# Patient Record
Sex: Female | Born: 1980 | Race: White | Hispanic: No | Marital: Married | Smoking: Never smoker
Health system: Southern US, Community
[De-identification: ages and names within clinical notes are randomized; demographics above are authoritative.]

## PROBLEM LIST (undated history)

## (undated) DIAGNOSIS — Z789 Other specified health status: Secondary | ICD-10-CM

## (undated) HISTORY — DX: Other specified health status: Z78.9

## (undated) HISTORY — PX: OVARIAN CYST REMOVAL: SHX89

## (undated) HISTORY — PX: PILONIDAL CYST EXCISION: SHX744

## (undated) HISTORY — PX: TUBAL LIGATION: SHX77

---

## 2005-08-18 ENCOUNTER — Ambulatory Visit: Payer: Self-pay | Admitting: Obstetrics & Gynecology

## 2006-01-13 ENCOUNTER — Emergency Department: Payer: Self-pay | Admitting: Unknown Physician Specialty

## 2006-03-12 ENCOUNTER — Emergency Department: Payer: Self-pay | Admitting: Emergency Medicine

## 2006-03-13 ENCOUNTER — Inpatient Hospital Stay: Payer: Self-pay | Admitting: Obstetrics & Gynecology

## 2006-09-04 ENCOUNTER — Ambulatory Visit: Payer: Self-pay | Admitting: Internal Medicine

## 2007-03-30 ENCOUNTER — Ambulatory Visit: Payer: Self-pay | Admitting: Internal Medicine

## 2007-06-20 ENCOUNTER — Ambulatory Visit: Payer: Self-pay | Admitting: Internal Medicine

## 2007-11-25 ENCOUNTER — Emergency Department: Payer: Self-pay | Admitting: Emergency Medicine

## 2008-02-25 ENCOUNTER — Ambulatory Visit: Payer: Self-pay | Admitting: Internal Medicine

## 2009-07-31 ENCOUNTER — Observation Stay: Payer: Self-pay

## 2009-08-01 ENCOUNTER — Inpatient Hospital Stay: Payer: Self-pay | Admitting: Unknown Physician Specialty

## 2009-08-04 ENCOUNTER — Ambulatory Visit: Payer: Self-pay | Admitting: Pediatrics

## 2011-08-25 ENCOUNTER — Ambulatory Visit: Payer: Self-pay

## 2011-08-25 LAB — CBC WITH DIFFERENTIAL/PLATELET
HCT: 35.2 % (ref 35.0–47.0)
Lymphocyte #: 1.4 10*3/uL (ref 1.0–3.6)
Lymphocyte %: 19 %
MCHC: 35.1 g/dL (ref 32.0–36.0)
MCV: 87 fL (ref 80–100)
Monocyte %: 7.3 %
RBC: 4.04 10*6/uL (ref 3.80–5.20)
RDW: 12.6 % (ref 11.5–14.5)
WBC: 7.2 10*3/uL (ref 3.6–11.0)

## 2011-08-26 ENCOUNTER — Inpatient Hospital Stay: Payer: Self-pay

## 2014-08-10 NOTE — Op Note (Signed)
PATIENT NAME:  Victoria White, [name] MR#:  [id number] DATE OF BIRTH:  1981/03/26  DATE OF PROCEDURE:  08/26/2011  PREOPERATIVE DIAGNOSIS: Intrauterine pregnancy, term, footling breech presentation.   POSTOPERATIVE DIAGNOSIS: Intrauterine pregnancy, term, footling breech presentation.   PROCEDURE PERFORMED: Low transverse cesarean section.   SURGEON: Deloris Pinghilip J. Luella Cookosenow, M.D.   FIRST ASSISTANT: [address]   ANESTHESIA:  Conduction.   OPERATIVE FINDINGS: Female infant, Reuel BoomDaniel.   DESCRIPTION OF PROCEDURE: After adequate conduction anesthesia, the patient was prepped and draped in routine fashion. A skin incision in modified Pfannenstiel fashion was made and carried down the various layers and the peritoneal cavity was entered. Bladder flap was created and the bladder was pushed down. A low transverse incision was made whereupon double footling breech was encountered. This was delivered without difficulty. The placenta was removed manually. The uterus was then reapproximated with continuous suture of Maxon. A bilateral partial salpingectomy was then performed with both tubes being doubly ligated and a portion removed. All areas of surgery were inspected and found to be hemostatic. The pelvis was lavaged with copious of saline. Interceed was placed. Rectus muscles were reapproximated in the midline. On-Q pump was inserted. The fascia was reapproximated with continuous suture of Maxon. The skin was closed with skin staples. Estimated blood loss 500 mL. The patient tolerated the procedure well and left the Operating Room in good condition. Sponge and needle counts were said to be correct at the end of the procedure.   ____________________________ Deloris PingPhilip J. Luella Cookosenow, MD pjr:ap D: 08/26/2011 09:52:33 ET                T: 08/26/2011 11:56:41 ET JOB#: [id number] cc: Deloris PingPhilip J. Luella Cookosenow, MD, <Dictator> Towana BadgerPHILIP J Suella Cogar MD ELECTRONICALLY SIGNED 08/28/2011 6:25

## 2015-03-24 ENCOUNTER — Emergency Department
Admission: EM | Admit: 2015-03-24 | Discharge: 2015-03-24 | Disposition: A | Discharge status: Home / Self Care | Payer: Self-pay | Attending: Emergency Medicine | Admitting: Emergency Medicine

## 2015-03-24 ENCOUNTER — Emergency Department: Payer: Self-pay

## 2015-03-24 ENCOUNTER — Encounter: Payer: Self-pay | Admitting: Medical Oncology

## 2015-03-24 DIAGNOSIS — W010XXA Fall on same level from slipping, tripping and stumbling without subsequent striking against object, initial encounter: Secondary | ICD-10-CM | POA: Insufficient documentation

## 2015-03-24 DIAGNOSIS — Y9301 Activity, walking, marching and hiking: Secondary | ICD-10-CM | POA: Insufficient documentation

## 2015-03-24 DIAGNOSIS — S52591A Other fractures of lower end of right radius, initial encounter for closed fracture: Secondary | ICD-10-CM | POA: Insufficient documentation

## 2015-03-24 DIAGNOSIS — Y9289 Other specified places as the place of occurrence of the external cause: Secondary | ICD-10-CM | POA: Insufficient documentation

## 2015-03-24 DIAGNOSIS — S52611A Displaced fracture of right ulna styloid process, initial encounter for closed fracture: Secondary | ICD-10-CM | POA: Insufficient documentation

## 2015-03-24 DIAGNOSIS — Y998 Other external cause status: Secondary | ICD-10-CM | POA: Insufficient documentation

## 2015-03-24 DIAGNOSIS — S52501A Unspecified fracture of the lower end of right radius, initial encounter for closed fracture: Secondary | ICD-10-CM

## 2015-03-24 MED ORDER — OXYCODONE-ACETAMINOPHEN 5-325 MG PO TABS
1.0000 | ORAL_TABLET | Freq: Once | ORAL | Status: AC
  Administered 2015-03-24: 1 via ORAL

## 2015-03-24 MED ORDER — OXYCODONE-ACETAMINOPHEN 5-325 MG PO TABS: 1.0000 | ORAL_TABLET | Freq: Four times a day (QID) | ORAL | Status: DC | PRN

## 2015-03-24 MED ORDER — OXYCODONE-ACETAMINOPHEN 5-325 MG PO TABS
ORAL_TABLET | ORAL | Status: DC
Start: 2015-03-24 — End: 2015-03-24
  Filled 2015-03-24: qty 1

## 2015-03-24 NOTE — ED Notes (Signed)
Pt presents with right wrist pain after falling this am. Cms intact. Swelling with possible deformity noted to right wrist.

## 2015-03-24 NOTE — ED Notes (Signed)
Pt reports slipping and falling this am, rt wrist pain.

## 2015-03-24 NOTE — Discharge Instructions (Signed)
Cast or Splint Care °Casts and splints support injured limbs and keep bones from moving while they heal. It is important to care for your cast or splint at home.   °HOME CARE INSTRUCTIONS °· Keep the cast or splint uncovered during the drying period. It can take 24 to 48 hours to dry if it is made of plaster. A fiberglass cast will dry in less than 1 hour. °· Do not rest the cast on anything harder than a pillow for the first 24 hours. °· Do not put weight on your injured limb or apply pressure to the cast until your health care provider gives you permission. °· Keep the cast or splint dry. Wet casts or splints can lose their shape and may not support the limb as well. A wet cast that has lost its shape can also create harmful pressure on your skin when it dries. Also, wet skin can become infected. °· Cover the cast or splint with a plastic bag when bathing or when out in the rain or snow. If the cast is on the trunk of the body, take sponge baths until the cast is removed. °· If your cast does become wet, dry it with a towel or a blow dryer on the cool setting only. °· Keep your cast or splint clean. Soiled casts may be wiped with a moistened cloth. °· Do not place any hard or soft foreign objects under your cast or splint, such as cotton, toilet paper, lotion, or powder. °· Do not try to scratch the skin under the cast with any object. The object could get stuck inside the cast. Also, scratching could lead to an infection. If itching is a problem, use a blow dryer on a cool setting to relieve discomfort. °· Do not trim or cut your cast or remove padding from inside of it. °· Exercise all joints next to the injury that are not immobilized by the cast or splint. For example, if you have a long leg cast, exercise the hip joint and toes. If you have an arm cast or splint, exercise the shoulder, elbow, thumb, and fingers. °· Elevate your injured arm or leg on 1 or 2 pillows for the first 1 to 3 days to decrease  swelling and pain. It is best if you can comfortably elevate your cast so it is higher than your heart. °SEEK MEDICAL CARE IF:  °· Your cast or splint cracks. °· Your cast or splint is too tight or too loose. °· You have unbearable itching inside the cast. °· Your cast becomes wet or develops a soft spot or area. °· You have a bad smell coming from inside your cast. °· You get an object stuck under your cast. °· Your skin around the cast becomes red or raw. °· You have new pain or worsening pain after the cast has been applied. °SEEK IMMEDIATE MEDICAL CARE IF:  °· You have fluid leaking through the cast. °· You are unable to move your fingers or toes. °· You have discolored (blue or white), cool, painful, or very swollen fingers or toes beyond the cast. °· You have tingling or numbness around the injured area. °· You have severe pain or pressure under the cast. °· You have any difficulty with your breathing or have shortness of breath. °· You have chest pain. °  °This information is not intended to replace advice given to you by your health care provider. Make sure you discuss any questions you have with your health care   provider. °  °Document Released: 04/01/2000 Document Revised: 01/23/2013 Document Reviewed: 10/11/2012 °Elsevier Interactive Patient Education ©2016 [employer] ° °Forearm Fracture °A forearm fracture is a break in one or both of the bones of your arm that are between the elbow and the wrist. Your forearm is made up of two bones: °· Radius. This is the bone on the inside of your arm near your thumb. °· Ulna. This is the bone on the outside of your arm near your little finger. °Middle forearm fractures usually break both the radius and the ulna. Most forearm fractures that involve both the ulna and radius will require surgery. °CAUSES °Common causes of this type of fracture include: °· Falling on an outstretched arm. °· Accidents, such as a car or bike accident. °· A hard, direct hit to the middle  part of your arm. °RISK FACTORS °You may be at higher risk for this type of fracture if: °· You play contact sports. °· You have a condition that causes your bones to be weak or thin (osteoporosis). °SIGNS AND SYMPTOMS °A forearm fracture causes pain immediately after the injury. Other signs and symptoms include: °· An abnormal bend or bump in your arm (deformity). °· Swelling. °· Numbness or tingling. °· Tenderness. °· Inability to turn your hand from side to side (rotate). °· Bruising. °DIAGNOSIS °Your health care provider may diagnose a forearm fracture based on: °· Your symptoms. °· Your medical history, including any recent injury. °· A physical exam. Your health care provider will look for any deformity and feel for tenderness over the break. Your health care provider will also check whether the bones are out of place. °· An X-ray exam to confirm the diagnosis and learn more about the type of fracture. °TREATMENT °The goals of treatment are to get the bone or bones in proper position for healing and to keep the bones from moving so they will heal over time. Your treatment will depend on many factors, especially the type of fracture that you have. °· If the fractured bone or bones: °¨ Are in the correct position (nondisplaced), you may only need to wear a cast or a splint. °¨ Have a slightly displaced fracture, you may need to have the bones moved back into place manually (closed reduction) before the splint or cast is put on. °· You may have a temporary splint before you have a cast. The splint allows room for some swelling. After a few days, a cast can replace the splint. °· You may have to wear the cast for 6-8 weeks or as directed by your health care provider. °· The cast may be changed after about 3 weeks or as directed by your health care provider. °· After your cast is removed, you may need physical therapy to regain full movement in your wrist or elbow. °· You may need emergency surgery if you  have: °¨ A fractured bone or bones that are out of position (displaced). °¨ A fracture with multiple fragments (comminuted fracture). °¨ A fracture that breaks the skin (open fracture). This type of fracture may require surgical wires, plates, or screws to hold the bone or bones in place. °· You may have X-rays every couple of weeks to check on your healing. °HOME CARE INSTRUCTIONS °If You Have a Cast: °· Do not stick anything inside the cast to scratch your skin. Doing that increases your risk of infection. °· Check the skin around the cast every day. Report any concerns to your health care   provider. You may put lotion on dry skin around the edges of the cast. Do not apply lotion to the skin underneath the cast. °If You Have a Splint: °· Wear it as directed by your health care provider. Remove it only as directed by your health care provider. °· Loosen the splint if your fingers become numb and tingle, or if they turn cold and blue. °Bathing °· Cover the cast or splint with a watertight plastic bag to protect it from water while you bathe or shower. Do not let the cast or splint get wet. °Managing Pain, Stiffness, and Swelling °· If directed, apply ice to the injured area: °¨ Put ice in a plastic bag. °¨ Place a towel between your skin and the bag. °¨ Leave the ice on for 20 minutes, 2-3 times a day. °· Move your fingers often to avoid stiffness and to lessen swelling. °· Raise the injured area above the level of your heart while you are sitting or lying down. °Driving °· Do not drive or operate heavy machinery while taking pain medicine. °· Do not drive while wearing a cast or splint on a hand that you use for driving. °Activity °· Return to your normal activities as directed by your health care provider. Ask your health care provider what activities are safe for you. °· Perform range-of-motion exercises only as directed by your health care provider. °Safety °· Do not use your injured limb to support your body  weight until your health care provider says that you can. °General Instructions °· Do not put pressure on any part of the cast or splint until it is fully hardened. This may take several hours. °· Keep the cast or splint clean and dry. °· Do not use any tobacco products, including cigarettes, chewing tobacco, or electronic cigarettes. Tobacco can delay bone healing. If you need help quitting, ask your health care provider. °· Take medicines only as directed by your health care provider. °· Keep all follow-up visits as directed by your health care provider. This is important. °SEEK MEDICAL CARE IF: °· Your pain medicine is not helping. °· Your cast or splint becomes wet or damaged or suddenly feels too tight. °· Your cast becomes loose. °· You have more severe pain or swelling than you did before the cast. °· You have severe pain when you stretch your fingers. °· You continue to have pain or stiffness in your elbow or your wrist after your cast is removed. °SEEK IMMEDIATE MEDICAL CARE IF: °· You cannot move your fingers. °· You lose feeling in your fingers or your hand. °· Your hand or your fingers turn cold and pale or blue. °· You notice a bad smell coming from your cast. °· You have drainage from underneath your cast. °· You have new stains from blood or drainage that is coming through your cast. °  °This information is not intended to replace advice given to you by your health care provider. Make sure you discuss any questions you have with your health care provider. °  °Document Released: 04/01/2000 Document Revised: 04/25/2014 Document Reviewed: 11/18/2013 °Elsevier Interactive Patient Education ©2016 Elsevier Inc. ° °

## 2015-03-24 NOTE — ED Provider Notes (Signed)
[facility] Emergency Department Provider Note  ____________________________________________  Time seen: Approximately 8:53 AM  I have reviewed the triage vital signs and the nursing notes.   HISTORY  Chief Complaint Wrist Pain    HPI Victoria White is a 34 y.o. female who presents to the emergency department for a FOOSH injury to right wrist.He reports she was walking down a ramp and slipped on the wet concrete landing on her wrist. She endorsed immediate pain. She does endorse some swelling to the distal aspect of forearm. She denies any numbness or tingling. She states the pain is sharp, constant, severe, worse with any movement. Patient denies any previous history of injury or surgery to same wrist.   History reviewed. No pertinent past medical history.  There are no active problems to display for this patient.   Past Surgical History  Procedure Laterality Date  . Cesarean section      Current Outpatient Rx  Name  Route  Sig  Dispense  Refill  . oxyCODONE-acetaminophen (ROXICET) 5-325 MG tablet   Oral   Take 1 tablet by mouth every 6 (six) hours as needed for severe pain.   20 tablet   0     Allergies Review of patient's allergies indicates no known allergies.  No family history on file.  Social History Social History  Substance Use Topics  . Smoking status: None  . Smokeless tobacco: None  . Alcohol Use: None    Review of Systems Constitutional: No fever/chills Eyes: No visual changes. ENT: No sore throat. Cardiovascular: Denies chest pain. Respiratory: Denies shortness of breath. Gastrointestinal: No abdominal pain.  No nausea, no vomiting.  No diarrhea.  No constipation. Genitourinary: Negative for dysuria. Musculoskeletal: Negative for back pain. Endorses right wrist pain. Skin: Negative for rash. Neurological: Negative for headaches, focal weakness or numbness.  10-point ROS otherwise  negative.  ____________________________________________   PHYSICAL EXAM:  VITAL SIGNS: ED Triage Vitals  Enc Vitals Group     BP 03/24/15 0838 129/68 mmHg     Pulse Rate 03/24/15 0836 62     Resp 03/24/15 0836 18     Temp 03/24/15 0836 98.2 F (36.8 C)     Temp Source 03/24/15 0836 Oral     SpO2 03/24/15 0836 97 %     Weight 03/24/15 0836 180 lb (81.647 kg)     Height 03/24/15 0836 5\' 7"  (1.702 m)     Head Cir --      Peak Flow --      Pain Score 03/24/15 0837 6     Pain Loc --      Pain Edu? --      Excl. in GC? --     Constitutional: Alert and oriented. Well appearing and in no acute distress. Eyes: Conjunctivae are normal. PERRL. EOMI. Head: Atraumatic. Nose: No congestion/rhinnorhea. Mouth/Throat: Mucous membranes are moist.  Oropharynx non-erythematous. Neck: No stridor.   Cardiovascular: Normal rate, regular rhythm. Grossly normal heart sounds.  Good peripheral circulation. Respiratory: Normal respiratory effort.  No retractions. Lungs CTAB. Gastrointestinal: Soft and nontender. No distention. No abdominal bruits. No CVA tenderness. Musculoskeletal: No lower extremity tenderness nor edema.  No joint effusions. No visible deformity to right forearm/wrist when compared with left. There is edema noted to the distal aspect of forearm. Extreme tenderness to palpation over distal radius. No palpable abnormality. Diffuse tenderness to palpation over distal ulna. No palpable abnormality. Pulses and sensation are intact. Good Refill all digits. Patient has extremely  limited range of motion in wrist due to pain. Patient has good flexion and extension of all digits. Neurologic:  Normal speech and language. No gross focal neurologic deficits are appreciated. No gait instability. Skin:  Skin is warm, dry and intact. No rash noted. Psychiatric: Mood and affect are normal. Speech and behavior are normal.  ____________________________________________   LABS (all labs ordered are  listed, but only abnormal results are displayed)  Labs Reviewed - No data to display ____________________________________________  EKG   ____________________________________________  RADIOLOGY  Right wrist x-ray Impression: Comminuted and dorsally impacted distal right radius fracture with DRU involvement. No definitive radiocarpal joint involvement. Ulnar styloid fracture. ____________________________________________   PROCEDURES  Procedure(s) performed: None  Critical Care performed: No  ____________________________________________   INITIAL IMPRESSION / ASSESSMENT AND PLAN / ED COURSE  Pertinent labs & imaging results that were available during my care of the patient were reviewed by me and considered in my medical decision making (see chart for details).  Patient's diagnosis is consistent with comminuted and dorsally impacted distal right radius fracture. Dr. Joice Lofts was consult 10 and he advised the patient may be splinted and followed up by himself. Patient is to be sent home with splint and pain medications. I advised patient of findings and diagnosis and treatment plan and the patient verbalizes understanding and compliance with same. Patient will call Dr. Joice Lofts and set up a follow-up. ____________________________________________   FINAL CLINICAL IMPRESSION(S) / ED DIAGNOSES  Final diagnoses:  Distal radius fracture, right, closed, initial encounter  Fracture of ulnar styloid, right, closed, initial encounter      Racheal Patches, PA-C 03/24/15 1031  Jene Every, MD 03/24/15 1056

## 2017-05-22 ENCOUNTER — Encounter: Payer: Self-pay | Admitting: Certified Nurse Midwife

## 2017-06-02 ENCOUNTER — Encounter: Payer: Self-pay | Admitting: Certified Nurse Midwife

## 2017-06-12 ENCOUNTER — Encounter: Payer: Self-pay | Admitting: Certified Nurse Midwife

## 2017-06-21 ENCOUNTER — Ambulatory Visit (INDEPENDENT_AMBULATORY_CARE_PROVIDER_SITE_OTHER): Payer: Self-pay | Admitting: Certified Nurse Midwife

## 2017-06-21 ENCOUNTER — Encounter: Payer: Self-pay | Admitting: Certified Nurse Midwife

## 2017-06-21 VITALS — BP 109/70 | HR 74 | Ht 67.0 in | Wt 208.0 lb

## 2017-06-21 DIAGNOSIS — Z0000 Encounter for general adult medical examination without abnormal findings: Secondary | ICD-10-CM

## 2017-06-21 NOTE — Progress Notes (Signed)
  Patient not seen , had to leave to go to hospital for a delivery. Pt encouraged to reschedule appointment.   Doreene BurkeAnnie Asia Dusenbury, CNM

## 2017-06-21 NOTE — Progress Notes (Signed)
New pt is here for a pap smear. Last one was 6 years ago. Has a history of abnormals but had them repeated and they were fine.

## 2017-06-21 NOTE — Patient Instructions (Signed)
Preventive Care 18-39 Years, Female Preventive care refers to lifestyle choices and visits with your health care provider that can promote health and wellness. What does preventive care include?  A yearly physical exam. This is also called an annual well check.  Dental exams once or twice a year.  Routine eye exams. Ask your health care provider how often you should have your eyes checked.  Personal lifestyle choices, including: ? Daily care of your teeth and gums. ? Regular physical activity. ? Eating a healthy diet. ? Avoiding tobacco and drug use. ? Limiting alcohol use. ? Practicing safe sex. ? Taking vitamin and mineral supplements as recommended by your health care provider. What happens during an annual well check? The services and screenings done by your health care provider during your annual well check will depend on your age, overall health, lifestyle risk factors, and family history of disease. Counseling Your health care provider may ask you questions about your:  Alcohol use.  Tobacco use.  Drug use.  Emotional well-being.  Home and relationship well-being.  Sexual activity.  Eating habits.  Work and work Statistician.  Method of birth control.  Menstrual cycle.  Pregnancy history.  Screening You may have the following tests or measurements:  Height, weight, and BMI.  Diabetes screening. This is done by checking your blood sugar (glucose) after you have not eaten for a while (fasting).  Blood pressure.  Lipid and cholesterol levels. These may be checked every 5 years starting at age 66.  Skin check.  Hepatitis C blood test.  Hepatitis B blood test.  Sexually transmitted disease (STD) testing.  BRCA-related cancer screening. This may be done if you have a family history of breast, ovarian, tubal, or peritoneal cancers.  Pelvic exam and Pap test. This may be done every 3 years starting at age 40. Starting at age 59, this may be done every 5  years if you have a Pap test in combination with an HPV test.  Discuss your test results, treatment options, and if necessary, the need for more tests with your health care provider. Vaccines Your health care provider may recommend certain vaccines, such as:  Influenza vaccine. This is recommended every year.  Tetanus, diphtheria, and acellular pertussis (Tdap, Td) vaccine. You may need a Td booster every 10 years.  Varicella vaccine. You may need this if you have not been vaccinated.  HPV vaccine. If you are 69 or younger, you may need three doses over 6 months.  Measles, mumps, and rubella (MMR) vaccine. You may need at least one dose of MMR. You may also need a second dose.  Pneumococcal 13-valent conjugate (PCV13) vaccine. You may need this if you have certain conditions and were not previously vaccinated.  Pneumococcal polysaccharide (PPSV23) vaccine. You may need one or two doses if you smoke cigarettes or if you have certain conditions.  Meningococcal vaccine. One dose is recommended if you are age 27-21 years and a first-year college student living in a residence hall, or if you have one of several medical conditions. You may also need additional booster doses.  Hepatitis A vaccine. You may need this if you have certain conditions or if you travel or work in places where you may be exposed to hepatitis A.  Hepatitis B vaccine. You may need this if you have certain conditions or if you travel or work in places where you may be exposed to hepatitis B.  Haemophilus influenzae type b (Hib) vaccine. You may need this if  you have certain risk factors.  Talk to your health care provider about which screenings and vaccines you need and how often you need them. This information is not intended to replace advice given to you by your health care provider. Make sure you discuss any questions you have with your health care provider. Document Released: 05/31/2001 Document Revised: 12/23/2015  Document Reviewed: 02/03/2015 Elsevier Interactive Patient Education  [employer].

## 2017-06-26 ENCOUNTER — Encounter: Payer: Self-pay | Admitting: Certified Nurse Midwife

## 2017-07-03 ENCOUNTER — Ambulatory Visit: Payer: Self-pay | Admitting: Certified Nurse Midwife

## 2017-07-03 ENCOUNTER — Encounter: Payer: Self-pay | Admitting: Certified Nurse Midwife

## 2017-07-03 VITALS — BP 126/79 | HR 66 | Ht 67.0 in | Wt 210.1 lb

## 2017-07-03 DIAGNOSIS — Z0000 Encounter for general adult medical examination without abnormal findings: Secondary | ICD-10-CM

## 2017-07-03 NOTE — Progress Notes (Signed)
GYNECOLOGY ANNUAL PREVENTATIVE CARE ENCOUNTER NOTE  Subjective:   Victoria White is a [age] yGarnette Gunner.o. [id number] female here for a routine annual gynecologic exam.  Current complaints: none.   Denies abnormal vaginal bleeding, discharge, pelvic pain, problems with intercourse or other gynecologic concerns.    Gynecologic History Patient's last menstrual period was 06/27/2017 (exact date). Contraception: tubal ligation Last Pap: 5 yrs ago. Results were: normal has hx of abnormal that reverted to normal  Last mammogram: n/a. Obstetric History OB History  Gravida Para Term Preterm AB Living  2 2 2     2   SAB TAB Ectopic Multiple Live Births          2    # Outcome Date GA Lbr Len/2nd Weight Sex Delivery Anes PTL Lv  2 Term 2013    M CS-Unspec   LIV  1 Term 2011    F Vag-Spont  N LIV      No past medical history on file.  Past Surgical History:  Procedure Laterality Date  . CESAREAN SECTION    . OVARIAN CYST REMOVAL    . PILONIDAL CYST EXCISION      No current outpatient medications on file prior to visit.   No current facility-administered medications on file prior to visit.     No Known Allergies  Social History   Socioeconomic History  . Marital status: Married    Spouse name: Not on file  . Number of children: Not on file  . Years of education: Not on file  . Highest education level: Not on file  Social Needs  . Financial resource strain: Not on file  . Food insecurity - worry: Not on file  . Food insecurity - inability: Not on file  . Transportation needs - medical: Not on file  . Transportation needs - non-medical: Not on file  Occupational History  . Not on file  Tobacco Use  . Smoking status: Never Smoker  . Smokeless tobacco: Never Used  Substance and Sexual Activity  . Alcohol use: No    Frequency: Never  . Drug use: No  . Sexual activity: Yes    Birth control/protection: Surgical  Other Topics Concern  . Not on file  Social History Narrative  . Not  on file    Family History  Problem Relation Age of Onset  . Cancer Father   . Cancer Maternal Grandmother   . Cancer Maternal Grandfather   . Cancer Paternal Grandmother   . Cancer Paternal Grandfather     The following portions of the patient's history were reviewed and updated as appropriate: allergies, current medications, past family history, past medical history, past social history, past surgical history and problem list.  Review of Systems Review of Systems  Constitutional: Negative.   HENT: Negative.   Eyes: Negative.   Respiratory: Negative.   Cardiovascular: Negative.   Gastrointestinal: Negative.   Genitourinary: Negative.   Musculoskeletal: Negative.   Skin: Negative.   Neurological: Negative.   Endo/Heme/Allergies: Negative.   Psychiatric/Behavioral:       Anxiety        Objective:  BP 126/79   Pulse 66   Ht 5\' 7"  (1.702 m)   Wt 210 lb 1 oz (95.3 kg)   LMP 06/27/2017 (Exact Date)   BMI 32.90 kg/m  CONSTITUTIONAL: Well-developed, well-nourished obese female in no acute distress.  HENT:  Normocephalic, atraumatic, External right and left ear normal. Oropharynx is clear and moist EYES: Conjunctivae and EOM are normal.  Pupils are equal, round, and reactive to light. No scleral icterus.  NECK: Normal range of motion, supple, no masses.  Normal thyroid.  SKIN: Skin is warm and dry. No rash noted. Not diaphoretic. No erythema. No pallor. NEUROLOGIC: Alert and oriented to person, place, and time. Normal reflexes, muscle tone coordination. No cranial nerve deficit noted. PSYCHIATRIC: Normal mood and affect. Normal behavior. Normal judgment and thought content. CARDIOVASCULAR: Normal heart rate noted, regular rhythm RESPIRATORY: Clear to auscultation bilaterally. Effort and breath sounds normal, no problems with respiration noted. BREASTS: Symmetric in size. No masses, skin changes, nipple drainage, or lymphadenopathy. ABDOMEN: Soft, normal bowel sounds, no  distention noted.  No tenderness, rebound or guarding.  PELVIC: Normal appearing external genitalia; normal appearing vaginal mucosa and cervix.  No abnormal discharge noted.  Pap smear obtained.  Normal uterine size, no other palpable masses, no uterine or adnexal tenderness. MUSCULOSKELETAL: Normal range of motion. No tenderness.  No cyanosis, clubbing, or edema.  2+ distal pulses.   Assessment and Plan:  Annual Well Women exam . Will follow up results of pap smear and manage accordingly. Mammogram not indicated Patient declines labs today  Routine preventative health maintenance measures emphasized. Please refer to After Visit Summary for other counseling recommendations.    [name], CNM  [facility]

## 2017-07-03 NOTE — Patient Instructions (Signed)
Preventive Care 18-39 Years, Female Preventive care refers to lifestyle choices and visits with your health care provider that can promote health and wellness. What does preventive care include?  A yearly physical exam. This is also called an annual well check.  Dental exams once or twice a year.  Routine eye exams. Ask your health care provider how often you should have your eyes checked.  Personal lifestyle choices, including: ? Daily care of your teeth and gums. ? Regular physical activity. ? Eating a healthy diet. ? Avoiding tobacco and drug use. ? Limiting alcohol use. ? Practicing safe sex. ? Taking vitamin and mineral supplements as recommended by your health care provider. What happens during an annual well check? The services and screenings done by your health care provider during your annual well check will depend on your age, overall health, lifestyle risk factors, and family history of disease. Counseling Your health care provider may ask you questions about your:  Alcohol use.  Tobacco use.  Drug use.  Emotional well-being.  Home and relationship well-being.  Sexual activity.  Eating habits.  Work and work Statistician.  Method of birth control.  Menstrual cycle.  Pregnancy history.  Screening You may have the following tests or measurements:  Height, weight, and BMI.  Diabetes screening. This is done by checking your blood sugar (glucose) after you have not eaten for a while (fasting).  Blood pressure.  Lipid and cholesterol levels. These may be checked every 5 years starting at age 66.  Skin check.  Hepatitis C blood test.  Hepatitis B blood test.  Sexually transmitted disease (STD) testing.  BRCA-related cancer screening. This may be done if you have a family history of breast, ovarian, tubal, or peritoneal cancers.  Pelvic exam and Pap test. This may be done every 3 years starting at age 40. Starting at age 59, this may be done every 5  years if you have a Pap test in combination with an HPV test.  Discuss your test results, treatment options, and if necessary, the need for more tests with your health care provider. Vaccines Your health care provider may recommend certain vaccines, such as:  Influenza vaccine. This is recommended every year.  Tetanus, diphtheria, and acellular pertussis (Tdap, Td) vaccine. You may need a Td booster every 10 years.  Varicella vaccine. You may need this if you have not been vaccinated.  HPV vaccine. If you are 69 or younger, you may need three doses over 6 months.  Measles, mumps, and rubella (MMR) vaccine. You may need at least one dose of MMR. You may also need a second dose.  Pneumococcal 13-valent conjugate (PCV13) vaccine. You may need this if you have certain conditions and were not previously vaccinated.  Pneumococcal polysaccharide (PPSV23) vaccine. You may need one or two doses if you smoke cigarettes or if you have certain conditions.  Meningococcal vaccine. One dose is recommended if you are age 27-21 years and a first-year college student living in a residence hall, or if you have one of several medical conditions. You may also need additional booster doses.  Hepatitis A vaccine. You may need this if you have certain conditions or if you travel or work in places where you may be exposed to hepatitis A.  Hepatitis B vaccine. You may need this if you have certain conditions or if you travel or work in places where you may be exposed to hepatitis B.  Haemophilus influenzae type b (Hib) vaccine. You may need this if  you have certain risk factors.  Talk to your health care provider about which screenings and vaccines you need and how often you need them. This information is not intended to replace advice given to you by your health care provider. Make sure you discuss any questions you have with your health care provider. Document Released: 05/31/2001 Document Revised: 12/23/2015  Document Reviewed: 02/03/2015 Elsevier Interactive Patient Education  [employer].

## 2017-07-03 NOTE — Progress Notes (Signed)
Pt is here for a pap smear. 

## 2017-07-05 ENCOUNTER — Telehealth: Payer: Self-pay

## 2017-07-05 LAB — PAP IG AND HPV HIGH-RISK
HPV, HIGH-RISK: NEGATIVE
PAP Smear Comment: 0

## 2017-07-05 NOTE — Telephone Encounter (Signed)
Pt informed

## 2020-04-27 ENCOUNTER — Encounter: Payer: 59 | Admitting: Internal Medicine

## 2020-05-18 ENCOUNTER — Encounter: Payer: Self-pay | Admitting: Physician Assistant

## 2020-05-18 ENCOUNTER — Ambulatory Visit (INDEPENDENT_AMBULATORY_CARE_PROVIDER_SITE_OTHER): Payer: Managed Care, Other (non HMO) | Admitting: Physician Assistant

## 2020-05-18 DIAGNOSIS — Z0001 Encounter for general adult medical examination with abnormal findings: Secondary | ICD-10-CM

## 2020-05-18 DIAGNOSIS — Z6830 Body mass index (BMI) 30.0-30.9, adult: Secondary | ICD-10-CM

## 2020-05-18 DIAGNOSIS — Z1231 Encounter for screening mammogram for malignant neoplasm of breast: Secondary | ICD-10-CM

## 2020-05-18 DIAGNOSIS — L918 Other hypertrophic disorders of the skin: Secondary | ICD-10-CM

## 2020-05-18 DIAGNOSIS — E6609 Other obesity due to excess calories: Secondary | ICD-10-CM

## 2020-05-18 DIAGNOSIS — Z124 Encounter for screening for malignant neoplasm of cervix: Secondary | ICD-10-CM

## 2020-05-18 DIAGNOSIS — R300 Dysuria: Secondary | ICD-10-CM

## 2020-05-18 DIAGNOSIS — Z113 Encounter for screening for infections with a predominantly sexual mode of transmission: Secondary | ICD-10-CM | POA: Diagnosis not present

## 2020-05-18 NOTE — Progress Notes (Addendum)
[facility] [place], [place] [address]  Internal MEDICINE  Office Visit Note  Patient Name: Victoria White  [id number]  [id number]  Date of Service: 05/25/2020  Chief Complaint  Patient presents with  . Annual Exam    Pt due for pap smear  . New Patient (Initial Visit)    Pt here as new patient to re-establish care     HPI Pt is here to establish care and for a routine health maintenance examination. Pt does have anxiety and previously tried a medication but prefers to manage without medication. She is doing well today and has no complaints. She has just started exercising again and is now tracking her food in an effort to lose weight. She does have a FHx of breast CA and is interested in obtaining a mammogram. BP 134/88 after pap.  Current Medication: No outpatient encounter medications on file as of 05/18/2020.   No facility-administered encounter medications on file as of 05/18/2020.    Surgical History: Past Surgical History:  Procedure Laterality Date  . CESAREAN SECTION    . OVARIAN CYST REMOVAL    . PILONIDAL CYST EXCISION    . TUBAL LIGATION Bilateral     Medical History: Past Medical History:  Diagnosis Date  . No pertinent past medical history     Family History: Family History  Problem Relation Age of Onset  . Cancer Father   . Cancer Maternal Grandmother   . Cancer Maternal Grandfather   . Cancer Paternal Grandmother   . Cancer Paternal Grandfather   . Asthma Mother       Review of Systems  Constitutional: Negative for chills, fatigue and unexpected weight change.  HENT: Positive for postnasal drip. Negative for congestion, ear pain, rhinorrhea, sneezing and sore throat.   Eyes: Negative for redness.  Respiratory: Negative for cough, chest tightness and shortness of breath.   Cardiovascular: Negative for chest pain and palpitations.  Gastrointestinal: Negative for abdominal pain, constipation, diarrhea, nausea and  vomiting.  Genitourinary: Negative for dysuria and frequency.  Musculoskeletal: Negative for arthralgias, back pain, joint swelling and neck pain.  Skin: Negative for rash.  Neurological: Negative.  Negative for tremors and numbness.  Hematological: Negative for adenopathy. Does not bruise/bleed easily.  Psychiatric/Behavioral: Negative for behavioral problems (Depression), sleep disturbance and suicidal ideas. The patient is nervous/anxious.      Vital Signs: BP 134/84   Ht $R'5\' 7"'bl$  (1.702 m)   Wt 197 lb 9.6 oz (89.6 kg)   BMI 30.95 kg/m    Physical Exam Constitutional:      General: She is not in acute distress.    Appearance: She is well-developed. She is obese. She is not diaphoretic.  HENT:     Head: Normocephalic and atraumatic.     Right Ear: External ear normal.     Left Ear: External ear normal.     Ears:     Comments: small skin tag present in R ear canal, not causing any discomfort    Nose: Nose normal.     Mouth/Throat:     Pharynx: No oropharyngeal exudate.  Eyes:     General: No scleral icterus.       Right eye: No discharge.        Left eye: No discharge.     Conjunctiva/sclera: Conjunctivae normal.     Pupils: Pupils are equal, round, and reactive to light.  Neck:     Thyroid: No thyromegaly.     Vascular:  No JVD.     Trachea: No tracheal deviation.  Cardiovascular:     Rate and Rhythm: Normal rate and regular rhythm.     Heart sounds: Normal heart sounds. No murmur heard. No friction rub. No gallop.   Pulmonary:     Effort: Pulmonary effort is normal. No respiratory distress.     Breath sounds: Normal breath sounds. No stridor. No wheezing or rales.  Chest:     Chest wall: No tenderness.  Abdominal:     General: Bowel sounds are normal. There is no distension.     Palpations: Abdomen is soft. There is no mass.     Tenderness: There is no abdominal tenderness. There is no guarding or rebound.  Musculoskeletal:        General: No tenderness or  deformity. Normal range of motion.     Cervical back: Normal range of motion and neck supple.  Lymphadenopathy:     Cervical: No cervical adenopathy.  Skin:    General: Skin is warm and dry.     Coloration: Skin is not pale.     Findings: No erythema or rash.  Neurological:     Mental Status: She is alert.     Cranial Nerves: No cranial nerve deficit.     Motor: No abnormal muscle tone.     Coordination: Coordination normal.     Deep Tendon Reflexes: Reflexes are normal and symmetric.  Psychiatric:        Behavior: Behavior normal.        Thought Content: Thought content normal.        Judgment: Judgment normal.      LABS: Recent Results (from the past 2160 hour(s))  UA/M w/rflx Culture, Routine     Status: None   Collection Time: 05/18/20 11:20 AM   Specimen: Urine   Urine  Result Value Ref Range   Specific Gravity, UA 1.023 1.005 - 1.030   pH, UA 7.0 5.0 - 7.5   Color, UA Yellow Yellow   Appearance Ur Clear Clear   Leukocytes,UA Negative Negative   Protein,UA Negative Negative/Trace   Glucose, UA Negative Negative   Ketones, UA Negative Negative   RBC, UA Negative Negative   Bilirubin, UA Negative Negative   Urobilinogen, Ur 0.2 0.2 - 1.0 mg/dL   Nitrite, UA Negative Negative   Microscopic Examination Comment     Comment: Microscopic follows if indicated.   Microscopic Examination See below:     Comment: Microscopic was indicated and was performed.   Urinalysis Reflex Comment     Comment: This specimen will not reflex to a Urine Culture.  Microscopic Examination     Status: None   Collection Time: 05/18/20 11:20 AM   Urine  Result Value Ref Range   WBC, UA 0-5 0 - 5 /hpf   RBC 0-2 0 - 2 /hpf   Epithelial Cells (non renal) 0-10 0 - 10 /hpf   Casts None seen None seen /lpf   Bacteria, UA None seen None seen/Few  IGP, Aptima HPV     Status: None   Collection Time: 05/18/20 11:21 AM  Result Value Ref Range   Interpretation NILM     Comment: NEGATIVE FOR  INTRAEPITHELIAL LESION OR MALIGNANCY.   Category NIL     Comment: Negative for Intraepithelial Lesion   Adequacy ENDO     Comment: Satisfactory for evaluation. Endocervical and/or squamous metaplastic cells (endocervical component) are present.    Clinician Provided ICD10 Comment  Comment: Z12.4   Performed by: Comment     Comment: Neil Crouch, Cytotechnologist (ASCP)   Note: Comment     Comment: The Pap smear is a screening test designed to aid in the detection of premalignant and malignant conditions of the uterine cervix.  It is not a diagnostic procedure and should not be used as the sole means of detecting cervical cancer.  Both false-positive and false-negative reports do occur.    Test Methodology Comment     Comment: This liquid based ThinPrep(R) pap test was screened with the use of an image guided system.    HPV Aptima Negative Negative    Comment: This nucleic acid amplification test detects fourteen high-risk HPV types (16,18,31,33,35,39,45,51,52,56,58,59,66,68) without differentiation.   NuSwab Vaginitis Plus (VG+)     Status: None   Collection Time: 05/18/20  1:28 PM  Result Value Ref Range   Atopobium vaginae Low - 0 Score   BVAB 2 Low - 0 Score   Megasphaera 1 Low - 0 Score    Comment: Calculate total score by adding the 3 individual bacterial vaginosis (BV) marker scores together.  Total score is interpreted as follows: Total score 0-1: Indicates the absence of BV. Total score   2: Indeterminate for BV. Additional clinical                  data should be evaluated to establish a                  diagnosis. Total score 3-6: Indicates the presence of BV. This test was developed and its performance characteristics determined by [facility].  It has not been cleared or approved by the Food and Drug Administration.    Candida albicans, NAA Negative Negative   Candida glabrata, NAA Negative Negative   Trich vag by NAA Negative Negative   Chlamydia  trachomatis, NAA Negative Negative   Neisseria gonorrhoeae, NAA Negative Negative  Comprehensive metabolic panel     Status: None   Collection Time: 05/20/20  8:45 AM  Result Value Ref Range   Glucose 96 65 - 99 mg/dL   BUN 9 6 - 20 mg/dL   Creatinine, Ser 3.66 0.57 - 1.00 mg/dL   GFR calc non Af Amer 114 >59 mL/min/1.73   GFR calc Af Amer 131 >59 mL/min/1.73    Comment: **In accordance with recommendations from the NKF-ASN Task force,**   [facility] is in the process of updating its eGFR calculation to the   2021 CKD-EPI creatinine equation that estimates kidney function   without a race variable.    BUN/Creatinine Ratio 15 9 - 23   Sodium 139 134 - 144 mmol/L   Potassium 4.4 3.5 - 5.2 mmol/L   Chloride 104 96 - 106 mmol/L   CO2 24 20 - 29 mmol/L   Calcium 9.0 8.7 - 10.2 mg/dL   Total Protein 6.6 6.0 - 8.5 g/dL   Albumin 4.2 3.8 - 4.8 g/dL   Globulin, Total 2.4 1.5 - 4.5 g/dL   Albumin/Globulin Ratio 1.8 1.2 - 2.2   Bilirubin Total 0.4 0.0 - 1.2 mg/dL   Alkaline Phosphatase 50 44 - 121 IU/L   AST 14 0 - 40 IU/L   ALT 10 0 - 32 IU/L      Assessment/Plan: 1. Encounter for general adult medical examination with abnormal findings Ordered age appropriate routine labs. - CBC with Differential/Platelet; Future - Lipid Panel With LDL/HDL Ratio; Future - TSH; Future - T4, free; Future - Comprehensive  metabolic panel - MM 3D SCREEN BREAST BILATERAL; Future  2. Encounter for screening for malignant neoplasm of cervix - IGP, Aptima HPV  3. Screening for STD (sexually transmitted disease) - NuSwab Vaginitis Plus (VG+)  4. Encounter for screening mammogram for high-risk patient - MM 3D SCREEN BREAST BILATERAL; Future  5. Class 1 Obesity Pt has just started an exercise routine this week and is tracking her caloric intake for weight loss goal. Discussed the importance of weight management through healthy eating and daily exercise as tolerated. Discussed the negative effects  obesity has on pulmonary health, cardiac health as well as overall general health and well being.  6. Skin tag of ear Small skin tag in R ear canal, not causing any discomfort or hearing impairment. Will continue to monitor and refer to ENT if it becomes bothersome.  7. Dysuria - UA/M w/rflx Culture, Routine - Microscopic Examination  General Counseling: Aerielle verbalizes understanding of the findings of todays visit and agrees with plan of treatment. I have discussed any further diagnostic evaluation that may be needed or ordered today. We also reviewed her medications today. she has been encouraged to call the office with any questions or concerns that should arise related to todays visit.    Counseling:    Orders Placed This Encounter  Procedures  . Microscopic Examination  . MM 3D SCREEN BREAST BILATERAL  . UA/M w/rflx Culture, Routine  . CBC with Differential/Platelet  . Lipid Panel With LDL/HDL Ratio  . TSH  . T4, free  . Comprehensive metabolic panel  . NuSwab Vaginitis Plus (VG+)    No orders of the defined types were placed in this encounter.   Total time spent: 40 Minutes  Time spent includes review of chart, medications, test results, and follow up plan with the patient.     Lavera Guise, MD  Internal Medicine

## 2020-05-19 LAB — MICROSCOPIC EXAMINATION
Bacteria, UA: NONE SEEN
Casts: NONE SEEN /lpf

## 2020-05-19 LAB — UA/M W/RFLX CULTURE, ROUTINE
Bilirubin, UA: NEGATIVE
Glucose, UA: NEGATIVE
Ketones, UA: NEGATIVE
Leukocytes,UA: NEGATIVE
Nitrite, UA: NEGATIVE
Protein,UA: NEGATIVE
RBC, UA: NEGATIVE
Specific Gravity, UA: 1.023 (ref 1.005–1.030)
Urobilinogen, Ur: 0.2 mg/dL (ref 0.2–1.0)
pH, UA: 7 (ref 5.0–7.5)

## 2020-05-20 ENCOUNTER — Encounter: Payer: Self-pay | Admitting: Advanced Practice Midwife

## 2020-05-20 LAB — NUSWAB VAGINITIS PLUS (VG+)
Candida albicans, NAA: NEGATIVE
Candida glabrata, NAA: NEGATIVE
Chlamydia trachomatis, NAA: NEGATIVE
Neisseria gonorrhoeae, NAA: NEGATIVE
Trich vag by NAA: NEGATIVE

## 2020-05-21 LAB — IGP, APTIMA HPV: HPV Aptima: NEGATIVE

## 2020-05-21 LAB — COMPREHENSIVE METABOLIC PANEL
ALT: 10 IU/L (ref 0–32)
AST: 14 IU/L (ref 0–40)
Albumin/Globulin Ratio: 1.8 (ref 1.2–2.2)
Albumin: 4.2 g/dL (ref 3.8–4.8)
Alkaline Phosphatase: 50 IU/L (ref 44–121)
BUN/Creatinine Ratio: 15 (ref 9–23)
BUN: 9 mg/dL (ref 6–20)
Bilirubin Total: 0.4 mg/dL (ref 0.0–1.2)
CO2: 24 mmol/L (ref 20–29)
Calcium: 9 mg/dL (ref 8.7–10.2)
Chloride: 104 mmol/L (ref 96–106)
Creatinine, Ser: 0.62 mg/dL (ref 0.57–1.00)
GFR calc Af Amer: 131 mL/min/{1.73_m2} (ref >59)
GFR calc non Af Amer: 114 mL/min/{1.73_m2} (ref >59)
Globulin, Total: 2.4 g/dL (ref 1.5–4.5)
Glucose: 96 mg/dL (ref 65–99)
Potassium: 4.4 mmol/L (ref 3.5–5.2)
Sodium: 139 mmol/L (ref 134–144)
Total Protein: 6.6 g/dL (ref 6.0–8.5)

## 2020-05-25 ENCOUNTER — Encounter: Payer: Self-pay | Admitting: Physician Assistant

## 2020-07-01 ENCOUNTER — Ambulatory Visit
Admission: RE | Admit: 2020-07-01 | Discharge: 2020-07-01 | Disposition: A | Discharge status: Home / Self Care | Payer: 59 | Source: Ambulatory Visit | Attending: Physician Assistant | Admitting: Physician Assistant

## 2020-07-01 ENCOUNTER — Other Ambulatory Visit: Payer: Self-pay

## 2020-07-01 DIAGNOSIS — Z1231 Encounter for screening mammogram for malignant neoplasm of breast: Secondary | ICD-10-CM | POA: Diagnosis not present

## 2020-07-01 DIAGNOSIS — Z0001 Encounter for general adult medical examination with abnormal findings: Secondary | ICD-10-CM

## 2020-09-15 ENCOUNTER — Ambulatory Visit (INDEPENDENT_AMBULATORY_CARE_PROVIDER_SITE_OTHER): Payer: 59

## 2020-09-15 ENCOUNTER — Ambulatory Visit (INDEPENDENT_AMBULATORY_CARE_PROVIDER_SITE_OTHER): Payer: 59 | Admitting: Podiatry

## 2020-09-15 ENCOUNTER — Encounter: Payer: Self-pay | Admitting: Podiatry

## 2020-09-15 ENCOUNTER — Other Ambulatory Visit: Payer: Self-pay

## 2020-09-15 DIAGNOSIS — M722 Plantar fascial fibromatosis: Secondary | ICD-10-CM

## 2020-09-15 NOTE — Progress Notes (Signed)
  Subjective:  Patient ID: Victoria White, female    DOB: 05-21-1980,  MRN: [id number]  Chief Complaint  Patient presents with  . Foot Pain    Patient presents today for left heel/arch pain x 1 week    40 y.o. female presents with the above complaint.  Patient presents with left heel pain has been going on for quite some time.  Patient states it started to radiate into the arch is worse in the morning and by the end of the day.  There is throbbing sharp bruise to it.  Patient has tried stretching it and of which has helped.  She would like to discuss treatment options where she has not seen anyone else prior to seeing me.   Review of Systems: Negative except as noted in the HPI. Denies N/V/F/Ch.  Past Medical History:  Diagnosis Date  . No pertinent past medical history    No current outpatient medications on file.  Social History   Tobacco Use  Smoking Status Never Smoker  Smokeless Tobacco Never Used    No Known Allergies Objective:  There were no vitals filed for this visit. There is no height or weight on file to calculate BMI. Constitutional Well developed. Well nourished.  Vascular Dorsalis pedis pulses palpable bilaterally. Posterior tibial pulses palpable bilaterally. Capillary refill normal to all digits.  No cyanosis or clubbing noted. Pedal hair growth normal.  Neurologic Normal speech. Oriented to person, place, and time. Epicritic sensation to light touch grossly present bilaterally.  Dermatologic Nails well groomed and normal in appearance. No open wounds. No skin lesions.  Orthopedic: Normal joint ROM without pain or crepitus bilaterally. No visible deformities. Tender to palpation at the calcaneal tuber left. No pain with calcaneal squeeze left. Ankle ROM diminished range of motion left. Silfverskiold Test: positive left.   Radiographs: Taken and reviewed. No acute fractures or dislocations. No evidence of stress fracture.  Plantar heel spur present.  Posterior heel spur absent.   Assessment:   1. Plantar fasciitis of left foot    Plan:  Patient was evaluated and treated and all questions answered.  Plantar Fasciitis, left - XR reviewed as above.  - Educated on icing and stretching. Instructions given.  - Injection delivered to the plantar fascia as below. - DME: Plantar Fascial Brace - Pharmacologic management: None  Procedure: Injection Tendon/Ligament Location: Left plantar fascia at the glabrous junction; medial approach. Skin Prep: alcohol Injectate: 0.5 cc 0.5% marcaine plain, 0.5 cc of 1% Lidocaine, 0.5 cc kenalog 10. Disposition: Patient tolerated procedure well. Injection site dressed with a band-aid.  No follow-ups on file.

## 2020-09-17 ENCOUNTER — Ambulatory Visit: Payer: Self-pay | Admitting: Podiatry

## 2020-10-13 ENCOUNTER — Ambulatory Visit: Payer: 59 | Admitting: Podiatry

## 2020-10-28 ENCOUNTER — Telehealth: Payer: Self-pay

## 2020-10-28 NOTE — Telephone Encounter (Signed)
Lmom to call us back 

## 2020-12-03 ENCOUNTER — Other Ambulatory Visit: Payer: Self-pay

## 2020-12-03 ENCOUNTER — Ambulatory Visit: Payer: 59 | Admitting: Podiatry

## 2020-12-03 DIAGNOSIS — M722 Plantar fascial fibromatosis: Secondary | ICD-10-CM

## 2020-12-03 NOTE — Progress Notes (Signed)
  Subjective:  Patient ID: Victoria White, female    DOB: 1980-12-02,  MRN: [id number]  Chief Complaint  Patient presents with   Injections    Pt would like another injection in her foot     40 y.o. female presents with the above complaint.  Patient presents for follow-up to left Planter fasciitis.  Patient states that the pain came back and is starting to get worse that has been going on for last 2 weeks.  Patient was lost to follow-up due to COVID.  She never came to get her second shot.  She denies any other acute complaint she would like to discuss treatment options.  She works on her foot in a house   Review of Systems: Negative except as noted in the HPI. Denies N/V/F/Ch.  Past Medical History:  Diagnosis Date   No pertinent past medical history    No current outpatient medications on file.  Social History   Tobacco Use  Smoking Status Never  Smokeless Tobacco Never    No Known Allergies Objective:  There were no vitals filed for this visit. There is no height or weight on file to calculate BMI. Constitutional Well developed. Well nourished.  Vascular Dorsalis pedis pulses palpable bilaterally. Posterior tibial pulses palpable bilaterally. Capillary refill normal to all digits.  No cyanosis or clubbing noted. Pedal hair growth normal.  Neurologic Normal speech. Oriented to person, place, and time. Epicritic sensation to light touch grossly present bilaterally.  Dermatologic Nails well groomed and normal in appearance. No open wounds. No skin lesions.  Orthopedic: Normal joint ROM without pain or crepitus bilaterally. No visible deformities. Tender to palpation at the calcaneal tuber left. No pain with calcaneal squeeze left. Ankle ROM diminished range of motion left. Silfverskiold Test: positive left.   Radiographs: Taken and reviewed. No acute fractures or dislocations. No evidence of stress fracture.  Plantar heel spur present. Posterior heel spur absent.    Assessment:   1. Plantar fasciitis of left foot     Plan:  Patient was evaluated and treated and all questions answered.  Plantar Fasciitis, left~recurrence - XR reviewed as above.  - Educated on icing and stretching. Instructions given.  - Injection delivered to the plantar fascia as below. - DME: Plantar Fascial Brace - Pharmacologic management: None  Procedure: Injection Tendon/Ligament Location: Left plantar fascia at the glabrous junction; medial approach. Skin Prep: alcohol Injectate: 0.5 cc 0.5% marcaine plain, 0.5 cc of 1% Lidocaine, 0.5 cc kenalog 10. Disposition: Patient tolerated procedure well. Injection site dressed with a band-aid.  No follow-ups on file.

## 2021-01-05 ENCOUNTER — Ambulatory Visit: Payer: 59 | Admitting: Podiatry

## 2021-03-24 ENCOUNTER — Encounter: Payer: Self-pay | Admitting: Nurse Practitioner

## 2021-03-24 ENCOUNTER — Ambulatory Visit (INDEPENDENT_AMBULATORY_CARE_PROVIDER_SITE_OTHER): Payer: Managed Care, Other (non HMO) | Admitting: Nurse Practitioner

## 2021-03-24 ENCOUNTER — Other Ambulatory Visit: Payer: Self-pay

## 2021-03-24 VITALS — BP 131/87 | HR 69 | Temp 98.8°F | Resp 16 | Ht 67.0 in | Wt 206.2 lb

## 2021-03-24 DIAGNOSIS — R300 Dysuria: Secondary | ICD-10-CM

## 2021-03-24 DIAGNOSIS — R102 Pelvic and perineal pain: Secondary | ICD-10-CM

## 2021-03-24 DIAGNOSIS — Z3202 Encounter for pregnancy test, result negative: Secondary | ICD-10-CM | POA: Diagnosis not present

## 2021-03-24 LAB — POCT URINE PREGNANCY: Preg Test, Ur: NEGATIVE

## 2021-03-24 LAB — POCT URINALYSIS DIPSTICK
Bilirubin, UA: NEGATIVE
Blood, UA: NEGATIVE
Glucose, UA: NEGATIVE
Ketones, UA: NEGATIVE
Leukocytes, UA: NEGATIVE
Nitrite, UA: NEGATIVE
Protein, UA: NEGATIVE
Spec Grav, UA: 1.025 (ref 1.010–1.025)
Urobilinogen, UA: 2 E.U./dL — AB
pH, UA: 6 (ref 5.0–8.0)

## 2021-03-24 NOTE — Progress Notes (Signed)
[facility] [address] [place], [place] [address]  Internal MEDICINE  Office Visit Note  Patient Name: Victoria White  [id number]  [id number]  Date of Service: 03/24/2021  Chief Complaint  Patient presents with   Acute Visit   Pelvic Pain    Thinks it might be ovarian cyst, urinating frequently when pt tries to lay down, hurts when urinating, bloating, has been going on for 2 weeks     HPI Sargun presents for an acute sick visit for urinary frequency pain with urination, bloating.  She reports that this has been going on for 2 weeks.  She denies any vaginal odor or itching, pain or burning.  She was tested for a UTI a few days ago and it was negative.  She she has a history of being hospitalized for an ovarian cyst in the past and she reports that this feels similar to that situation.    Current Medication:  No outpatient encounter medications on file as of 03/24/2021.   No facility-administered encounter medications on file as of 03/24/2021.      Medical History: Past Medical History:  Diagnosis Date   No pertinent past medical history      Vital Signs: BP 131/87   Pulse 69   Temp 98.8 F (37.1 C)   Resp 16   Ht 5\' 7"  (1.702 m)   Wt 206 lb 3.2 oz (93.5 kg)   SpO2 99%   BMI 32.30 kg/m    Review of Systems  Constitutional:  Negative for chills, fatigue and unexpected weight change.  HENT:  Negative for congestion, rhinorrhea, sneezing and sore throat.   Eyes:  Negative for redness.  Respiratory:  Negative for cough, chest tightness and shortness of breath.   Cardiovascular:  Negative for chest pain and palpitations.  Gastrointestinal:  Negative for abdominal pain, constipation, diarrhea, nausea and vomiting.  Genitourinary:  Negative for dysuria and frequency.  Musculoskeletal:  Negative for arthralgias, back pain, joint swelling and neck pain.  Skin:  Negative for rash.  Neurological: Negative.  Negative for tremors and numbness.   Hematological:  Negative for adenopathy. Does not bruise/bleed easily.  Psychiatric/Behavioral:  Negative for behavioral problems (Depression), sleep disturbance and suicidal ideas. The patient is not nervous/anxious.    Physical Exam Vitals reviewed.  Constitutional:      General: She is not in acute distress.    Appearance: Normal appearance. She is obese. She is not ill-appearing.  HENT:     Head: Normocephalic and atraumatic.  Eyes:     Pupils: Pupils are equal, round, and reactive to light.  Cardiovascular:     Rate and Rhythm: Normal rate and regular rhythm.  Pulmonary:     Effort: Pulmonary effort is normal. No respiratory distress.  Neurological:     Mental Status: She is alert and oriented to person, place, and time.     Cranial Nerves: No cranial nerve deficit.     Coordination: Coordination normal.     Gait: Gait normal.  Psychiatric:        Mood and Affect: Mood normal.        Behavior: Behavior normal.      Assessment/Plan: 1. Pelvic pain Pelvic pain with history of ovarian cyst. Ultrasound ordered. Urine pregnancy test ordered, result negative.  - Pelvic Complete With Transvaginal; Future - POCT urine pregnancy  2. Encounter for pregnancy test, result negative Patient had a tubal ligation but is having pelvic pain, pregnancy test done to rule out  tubal pregnancy.   3. Dysuria Urinalysis negative for UTI - POCT Urinalysis Dipstick   General Counseling: Janilah verbalizes understanding of the findings of todays visit and agrees with plan of treatment. I have discussed any further diagnostic evaluation that may be needed or ordered today. We also reviewed her medications today. she has been encouraged to call the office with any questions or concerns that should arise related to todays visit.    Counseling:    Orders Placed This Encounter  Procedures   US Pelvic Complete With Transvaginal   POCT Urinalysis Dipstick   POCT urine pregnancy    No  orders of the defined types were placed in this encounter.   Return if symptoms worsen or fail to improve, for and f/u to discuss pelvic ultrasound.  [place] Controlled Substance Database was reviewed by me for overdose risk score (ORS)  Time spent:30 Minutes Time spent with patient included reviewing progress notes, labs, imaging studies, and discussing plan for follow up.   This patient was seen by Sallyanne Kuster, FNP-C in collaboration with Dr. Beverely Risen as a part of collaborative care agreement.  Cionna Collantes R. Tedd Sias, MSN, FNP-C Internal Medicine

## 2021-03-25 ENCOUNTER — Ambulatory Visit
Admission: RE | Admit: 2021-03-25 | Discharge: 2021-03-25 | Disposition: A | Discharge status: Home / Self Care | Payer: 59 | Source: Ambulatory Visit | Attending: Nurse Practitioner | Admitting: Nurse Practitioner

## 2021-03-25 DIAGNOSIS — R102 Pelvic and perineal pain: Secondary | ICD-10-CM | POA: Diagnosis present

## 2021-03-31 NOTE — Progress Notes (Signed)
Normal pelvic ultrasound. Still may have been an ovarian cyst that ruptured. If still having pelvic pain, we can send a referral to OBGYN.

## 2021-04-02 ENCOUNTER — Telehealth: Payer: Self-pay

## 2021-04-02 NOTE — Telephone Encounter (Signed)
Pt advised pelvic u/s is normal and she said don't have no more pain she like to wait for referral for OBGYN

## 2021-05-17 ENCOUNTER — Encounter: Payer: 59 | Admitting: Physician Assistant

## 2021-08-10 ENCOUNTER — Other Ambulatory Visit: Payer: Self-pay | Admitting: Physician Assistant

## 2021-08-10 DIAGNOSIS — Z1231 Encounter for screening mammogram for malignant neoplasm of breast: Secondary | ICD-10-CM

## 2021-09-28 ENCOUNTER — Ambulatory Visit
Admission: RE | Admit: 2021-09-28 | Discharge: 2021-09-28 | Disposition: A | Discharge status: Home / Self Care | Payer: Managed Care, Other (non HMO) | Source: Ambulatory Visit | Attending: Physician Assistant | Admitting: Physician Assistant

## 2021-09-28 DIAGNOSIS — Z1231 Encounter for screening mammogram for malignant neoplasm of breast: Secondary | ICD-10-CM | POA: Diagnosis present

## 2021-10-25 ENCOUNTER — Telehealth: Payer: Self-pay

## 2021-10-25 ENCOUNTER — Other Ambulatory Visit: Payer: Self-pay

## 2021-10-25 MED ORDER — FLUCONAZOLE 150 MG PO TABS: 150.0000 mg | ORAL_TABLET | Freq: Once | ORAL | 0 refills | Status: AC

## 2021-10-25 NOTE — Telephone Encounter (Signed)
Pt called that she was having yeast infection sh is unable to come in as per lauren send diflucan and advised her to keep appt in august

## 2021-11-22 ENCOUNTER — Ambulatory Visit (INDEPENDENT_AMBULATORY_CARE_PROVIDER_SITE_OTHER): Payer: Managed Care, Other (non HMO) | Admitting: Physician Assistant

## 2021-11-22 ENCOUNTER — Encounter: Payer: Self-pay | Admitting: Physician Assistant

## 2021-11-22 DIAGNOSIS — E782 Mixed hyperlipidemia: Secondary | ICD-10-CM

## 2021-11-22 DIAGNOSIS — R5383 Other fatigue: Secondary | ICD-10-CM

## 2021-11-22 DIAGNOSIS — R300 Dysuria: Secondary | ICD-10-CM

## 2021-11-22 DIAGNOSIS — R946 Abnormal results of thyroid function studies: Secondary | ICD-10-CM

## 2021-11-22 DIAGNOSIS — Z0001 Encounter for general adult medical examination with abnormal findings: Secondary | ICD-10-CM

## 2021-11-22 DIAGNOSIS — E6609 Other obesity due to excess calories: Secondary | ICD-10-CM

## 2021-11-22 DIAGNOSIS — Z6830 Body mass index (BMI) 30.0-30.9, adult: Secondary | ICD-10-CM

## 2021-11-22 NOTE — Progress Notes (Signed)
[facility] [address] [place], [place] [address]  Internal MEDICINE  Office Visit Note  Patient Name: Victoria White  [id number]  [id number]  Date of Service: 11/22/2021  Chief Complaint  Patient presents with   Annual Exam     HPI Pt is here for routine health maintenance examination and has no complaints today -Mammogram up to date, pap done last year and was normal -Sleeping ok -Walking regularly, 6 miles on weekends, 3 miles a day during the week most days before work -mostly home cooked meals -Not taking any medications -Does have form to be completed for insurance for CPE and requires sugar and cholesterol be checked. She is due for fasting labs anyway and states she is fasting today and will go for labs now. Requests form be faxed once labs filled out on it.  Current Medication: No outpatient encounter medications on file as of 11/22/2021.   No facility-administered encounter medications on file as of 11/22/2021.    Surgical History: Past Surgical History:  Procedure Laterality Date   CESAREAN SECTION     OVARIAN CYST REMOVAL     PILONIDAL CYST EXCISION     TUBAL LIGATION Bilateral     Medical History: Past Medical History:  Diagnosis Date   No pertinent past medical history     Family History: Family History  Problem Relation Age of Onset   Cancer Father    Cancer Maternal Grandmother    Cancer Maternal Grandfather    Cancer Paternal Grandmother    Cancer Paternal Grandfather    Asthma Mother       Review of Systems  Constitutional:  Negative for chills, fatigue and unexpected weight change.  HENT:  Negative for congestion, postnasal drip, rhinorrhea, sneezing and sore throat.   Eyes:  Negative for redness.  Respiratory:  Negative for cough, chest tightness and shortness of breath.   Cardiovascular:  Negative for chest pain and palpitations.  Gastrointestinal:  Negative for abdominal pain, constipation, diarrhea, nausea and  vomiting.  Genitourinary:  Negative for dysuria and frequency.  Musculoskeletal:  Negative for arthralgias, back pain, joint swelling and neck pain.  Skin:  Negative for rash.  Neurological: Negative.  Negative for tremors and numbness.  Hematological:  Negative for adenopathy. Does not bruise/bleed easily.  Psychiatric/Behavioral:  Negative for behavioral problems (Depression), sleep disturbance and suicidal ideas. The patient is not nervous/anxious.      Vital Signs: BP 120/70   Pulse 62   Temp 98 F (36.7 C)   Resp 16   Ht 5\' 7"  (1.702 m)   Wt 200 lb (90.7 kg)   SpO2 98%   BMI 31.32 kg/m    Physical Exam Vitals and nursing note reviewed.  Constitutional:      General: She is not in acute distress.    Appearance: Normal appearance. She is well-developed. She is obese. She is not diaphoretic.  HENT:     Head: Normocephalic and atraumatic.     Mouth/Throat:     Pharynx: No oropharyngeal exudate.  Eyes:     Pupils: Pupils are equal, round, and reactive to light.  Neck:     Thyroid: No thyromegaly.     Vascular: No JVD.     Trachea: No tracheal deviation.  Cardiovascular:     Rate and Rhythm: Normal rate and regular rhythm.     Heart sounds: Normal heart sounds. No murmur heard.    No friction rub. No gallop.  Pulmonary:     Effort:  Pulmonary effort is normal. No respiratory distress.     Breath sounds: No wheezing or rales.  Chest:     Chest wall: No tenderness.  Abdominal:     General: Bowel sounds are normal.     Palpations: Abdomen is soft.     Tenderness: There is no abdominal tenderness.  Musculoskeletal:        General: Normal range of motion.     Cervical back: Normal range of motion and neck supple.  Lymphadenopathy:     Cervical: No cervical adenopathy.  Skin:    General: Skin is warm and dry.  Neurological:     Mental Status: She is alert and oriented to person, place, and time.     Cranial Nerves: No cranial nerve deficit.  Psychiatric:         Behavior: Behavior normal.        Thought Content: Thought content normal.        Judgment: Judgment normal.      LABS: No results found for this or any previous visit (from the past 2160 hour(s)).     Assessment/Plan: 1. Encounter for general adult medical examination with abnormal findings CPE performed, UTD on mammogram and pap, will order routine fasting labs - CBC w/Diff/Platelet - Comprehensive metabolic panel - Lipid Panel With LDL/HDL Ratio - TSH + free T4  2. Abnormal thyroid exam - TSH + free T4  3. Mixed hyperlipidemia - Lipid Panel With LDL/HDL Ratio  4. Other fatigue - CBC w/Diff/Platelet - Comprehensive metabolic panel - Lipid Panel With LDL/HDL Ratio - TSH + free T4  5. Class 1 obesity due to excess calories without serious comorbidity with body mass index (BMI) of 30.0 to 30.9 in adult Continue to work on diet and exercise  6. Dysuria - UA/M w/rflx Culture, Routine   General Counseling: Deronda verbalizes understanding of the findings of todays visit and agrees with plan of treatment. I have discussed any further diagnostic evaluation that may be needed or ordered today. We also reviewed her medications today. she has been encouraged to call the office with any questions or concerns that should arise related to todays visit.    Counseling:    Orders Placed This Encounter  Procedures   UA/M w/rflx Culture, Routine   CBC w/Diff/Platelet   Comprehensive metabolic panel   Lipid Panel With LDL/HDL Ratio   TSH + free T4    No orders of the defined types were placed in this encounter.   This patient was seen by Lynn Ito, PA-C in collaboration with Dr. Beverely Risen as a part of collaborative care agreement.  Total time spent:35 Minutes  Time spent includes review of chart, medications, test results, and follow up plan with the patient.     Lyndon Code, MD  Internal Medicine

## 2021-11-23 LAB — CBC WITH DIFFERENTIAL/PLATELET
Basophils Absolute: 0 10*3/uL (ref 0.0–0.2)
Basos: 1 %
EOS (ABSOLUTE): 0.2 10*3/uL (ref 0.0–0.4)
Eos: 4 %
Hematocrit: 41.9 % (ref 34.0–46.6)
Hemoglobin: 14.1 g/dL (ref 11.1–15.9)
Immature Grans (Abs): 0 10*3/uL (ref 0.0–0.1)
Immature Granulocytes: 0 %
Lymphocytes Absolute: 1.4 10*3/uL (ref 0.7–3.1)
Lymphs: 26 %
MCH: 29.7 pg (ref 26.6–33.0)
MCHC: 33.7 g/dL (ref 31.5–35.7)
MCV: 88 fL (ref 79–97)
Monocytes Absolute: 0.4 10*3/uL (ref 0.1–0.9)
Monocytes: 8 %
Neutrophils Absolute: 3.3 10*3/uL (ref 1.4–7.0)
Neutrophils: 61 %
Platelets: 257 10*3/uL (ref 150–450)
RBC: 4.75 x10E6/uL (ref 3.77–5.28)
RDW: 11.9 % (ref 11.7–15.4)
WBC: 5.3 10*3/uL (ref 3.4–10.8)

## 2021-11-23 LAB — UA/M W/RFLX CULTURE, ROUTINE
Bilirubin, UA: NEGATIVE
Glucose, UA: NEGATIVE
Ketones, UA: NEGATIVE
Leukocytes,UA: NEGATIVE
Nitrite, UA: NEGATIVE
Protein,UA: NEGATIVE
RBC, UA: NEGATIVE
Specific Gravity, UA: 1.023 (ref 1.005–1.030)
Urobilinogen, Ur: 0.2 mg/dL (ref 0.2–1.0)
pH, UA: 5.5 (ref 5.0–7.5)

## 2021-11-23 LAB — COMPREHENSIVE METABOLIC PANEL
ALT: 11 IU/L (ref 0–32)
AST: 13 IU/L (ref 0–40)
Albumin/Globulin Ratio: 1.9 (ref 1.2–2.2)
Albumin: 4.5 g/dL (ref 3.9–4.9)
Alkaline Phosphatase: 49 IU/L (ref 44–121)
BUN/Creatinine Ratio: 15 (ref 9–23)
BUN: 11 mg/dL (ref 6–24)
Bilirubin Total: 0.8 mg/dL (ref 0.0–1.2)
CO2: 22 mmol/L (ref 20–29)
Calcium: 9.4 mg/dL (ref 8.7–10.2)
Chloride: 105 mmol/L (ref 96–106)
Creatinine, Ser: 0.73 mg/dL (ref 0.57–1.00)
Globulin, Total: 2.4 g/dL (ref 1.5–4.5)
Glucose: 99 mg/dL (ref 70–99)
Potassium: 4.3 mmol/L (ref 3.5–5.2)
Sodium: 140 mmol/L (ref 134–144)
Total Protein: 6.9 g/dL (ref 6.0–8.5)
eGFR: 106 mL/min/{1.73_m2} (ref >59)

## 2021-11-23 LAB — LIPID PANEL WITH LDL/HDL RATIO
Cholesterol, Total: 154 mg/dL (ref 100–199)
HDL: 49 mg/dL (ref >39)
LDL Chol Calc (NIH): 85 mg/dL (ref 0–99)
LDL/HDL Ratio: 1.7 ratio (ref 0.0–3.2)
Triglycerides: 109 mg/dL (ref 0–149)
VLDL Cholesterol Cal: 20 mg/dL (ref 5–40)

## 2021-11-23 LAB — MICROSCOPIC EXAMINATION
Bacteria, UA: NONE SEEN
Casts: NONE SEEN /lpf
RBC, Urine: NONE SEEN /hpf (ref 0–2)
WBC, UA: NONE SEEN /hpf (ref 0–5)

## 2021-11-23 LAB — TSH+FREE T4
Free T4: 1.24 ng/dL (ref 0.82–1.77)
TSH: 0.818 u[IU]/mL (ref 0.450–4.500)

## 2021-11-26 ENCOUNTER — Telehealth: Payer: Self-pay

## 2021-11-26 NOTE — Telephone Encounter (Signed)
-----   Message from Carlean Jews, PA-C sent at 11/26/2021  1:01 PM EDT ----- Please let her know that her labs are normal and we will fax her wellness form

## 2021-11-26 NOTE — Telephone Encounter (Signed)
Spoke with patient regarding lab results. 

## 2022-07-25 IMAGING — MG MM DIGITAL SCREENING BILAT W/ TOMO AND CAD
8 series · 8 of 24 positions shown · non-contrast
Comparison: Previous exam(s).

CLINICAL DATA: Screening.

EXAM:
DIGITAL SCREENING BILATERAL MAMMOGRAM WITH TOMOSYNTHESIS AND CAD
TECHNIQUE: Bilateral screening digital craniocaudal and mediolateral oblique
mammograms were obtained. Bilateral screening digital breast
tomosynthesis was performed. The images were evaluated with
computer-aided detection.

[R CC synth-2D]
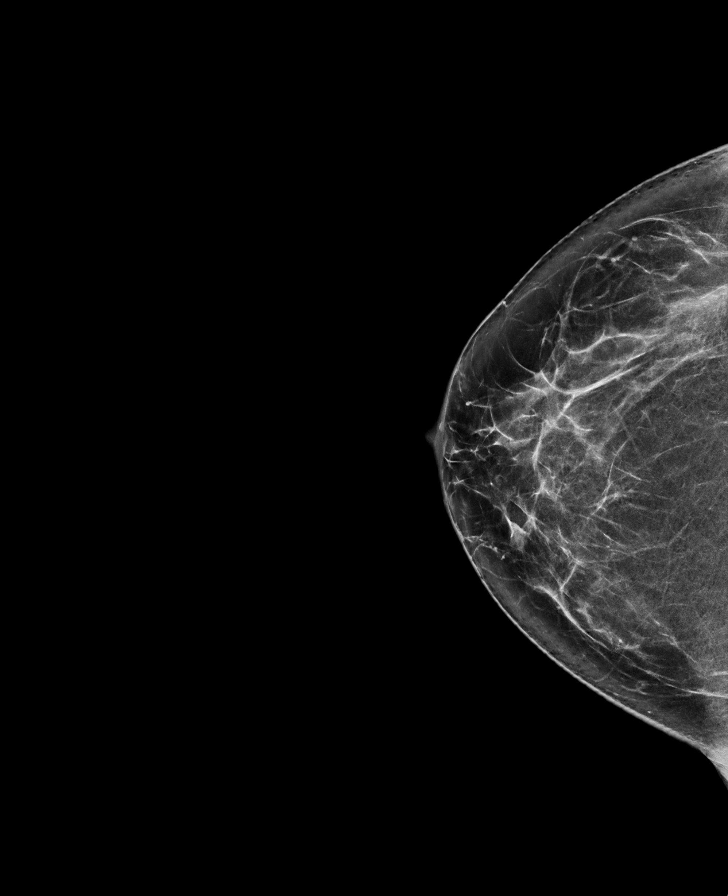

[L CC synth-2D]
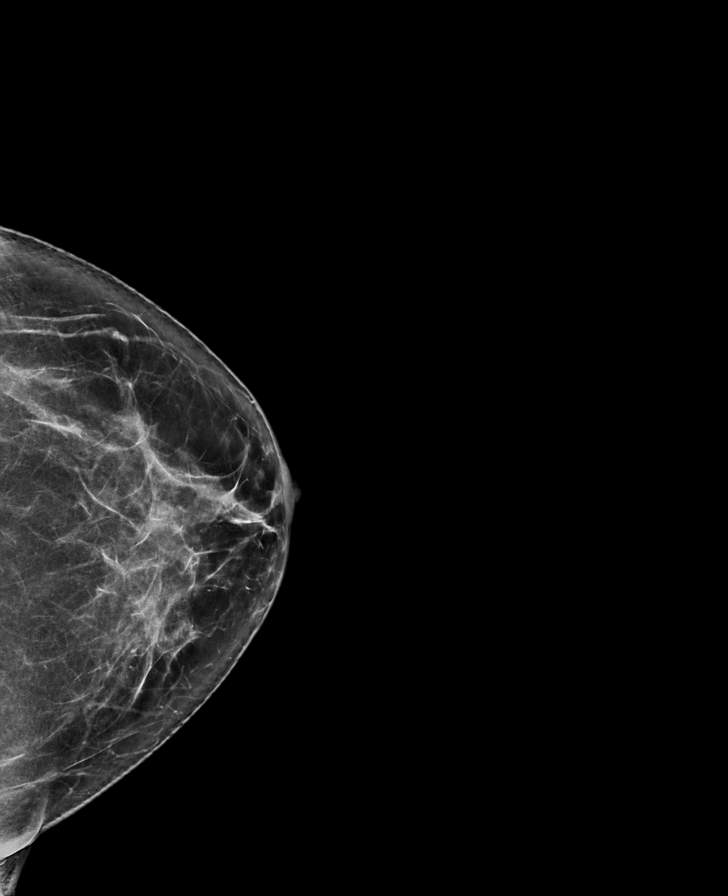

[L MLO synth-2D]
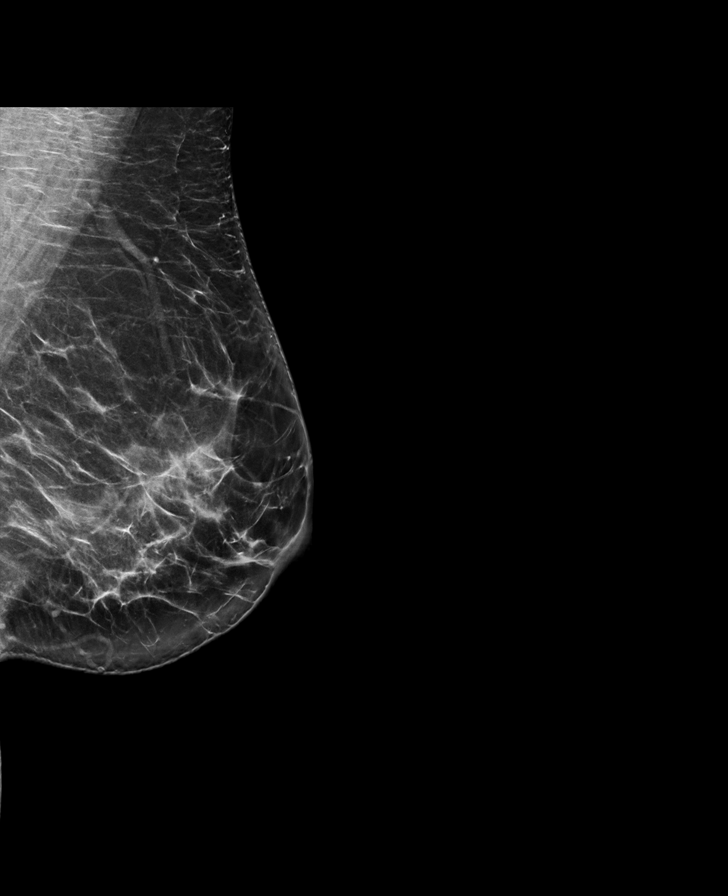

[R MLO synth-2D]
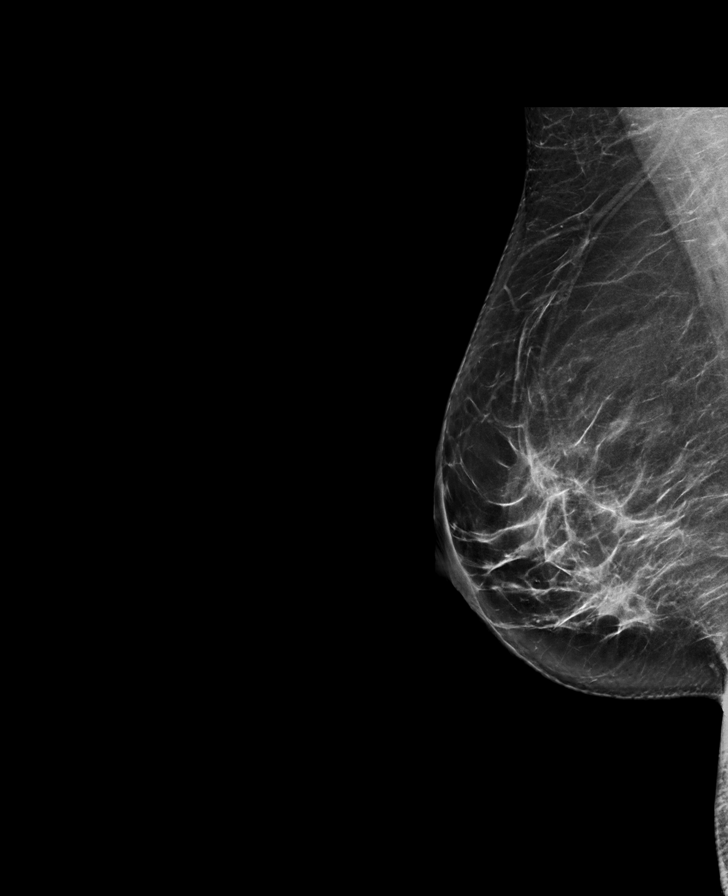

[L CC tomo · tomo slice 36/71.0]
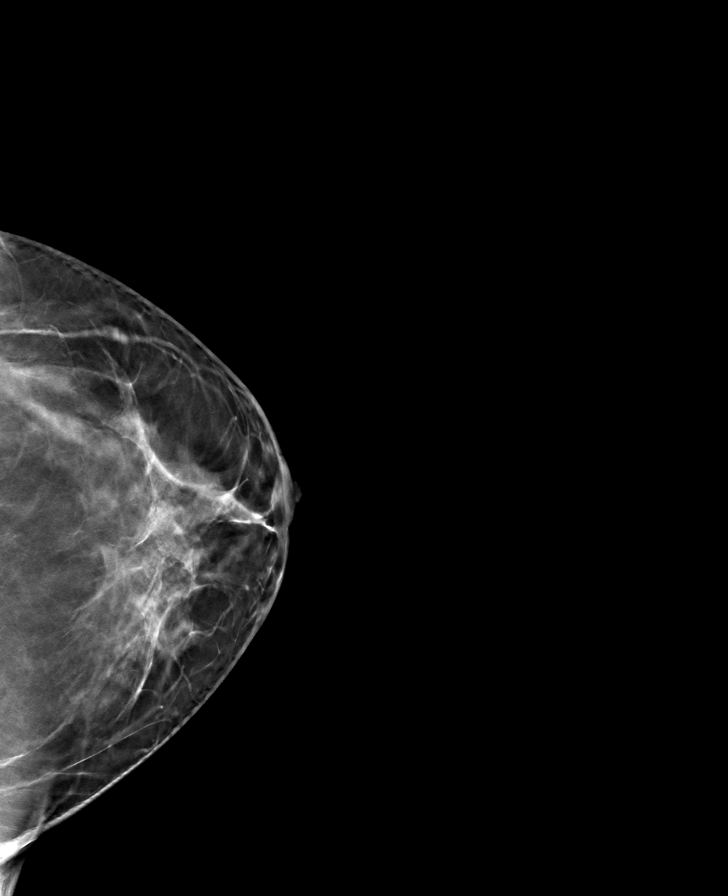

[L MLO tomo · tomo slice 39/78.0]
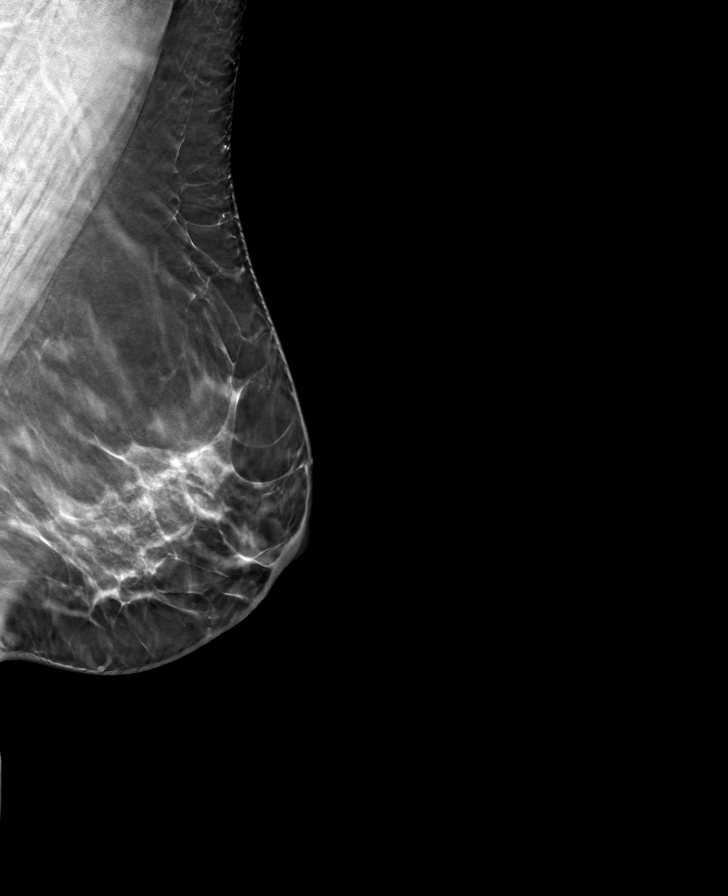

[R CC tomo · tomo slice 37/73.0]
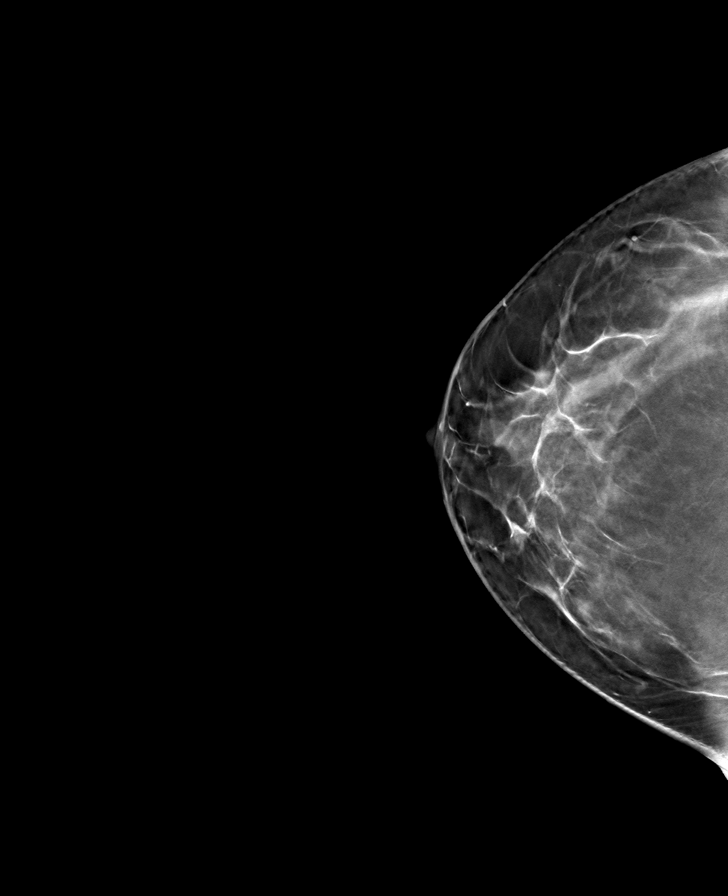

[R MLO tomo · tomo slice 41/81.0]
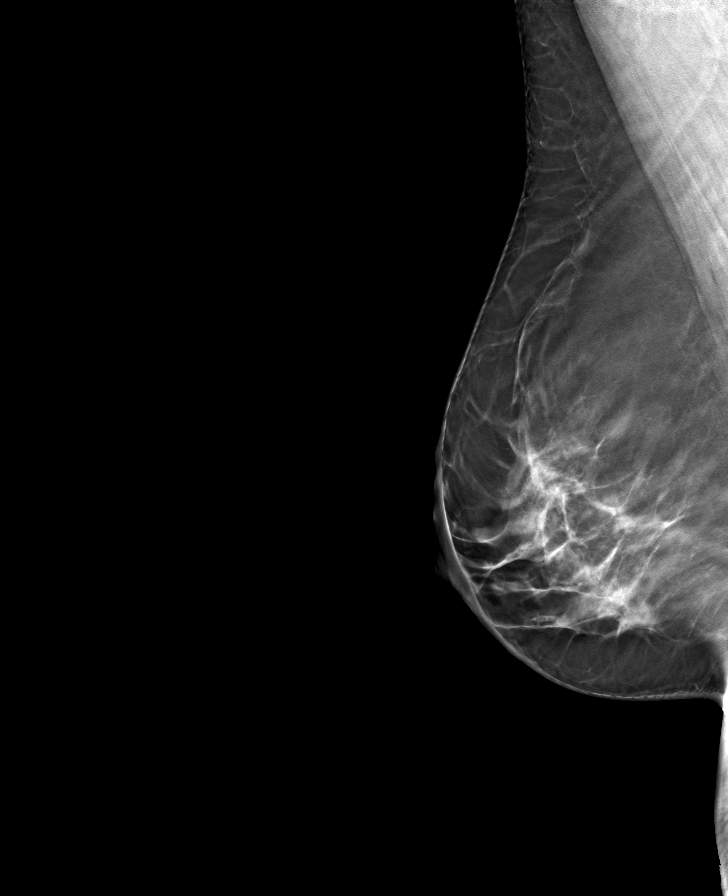

[8 of 24 positions shown; findings below may reference images not displayed]

ACR Breast Density Category b: There are scattered areas of
fibroglandular density.
FINDINGS: There are no findings suspicious for malignancy.
IMPRESSION: No mammographic evidence of malignancy. A result letter of this
screening mammogram will be mailed directly to the patient.

RECOMMENDATION:
Screening mammogram in one year. (Code:51-O-LD2)

BI-RADS CATEGORY  1: Negative.

## 2022-08-15 ENCOUNTER — Other Ambulatory Visit: Payer: Self-pay | Admitting: Physician Assistant

## 2022-08-15 DIAGNOSIS — Z1231 Encounter for screening mammogram for malignant neoplasm of breast: Secondary | ICD-10-CM

## 2022-10-03 ENCOUNTER — Ambulatory Visit
Admission: RE | Admit: 2022-10-03 | Discharge: 2022-10-03 | Disposition: A | Discharge status: Home / Self Care | Payer: Managed Care, Other (non HMO) | Source: Ambulatory Visit | Attending: Physician Assistant | Admitting: Physician Assistant

## 2022-10-03 DIAGNOSIS — Z1231 Encounter for screening mammogram for malignant neoplasm of breast: Secondary | ICD-10-CM | POA: Insufficient documentation

## 2022-11-28 ENCOUNTER — Ambulatory Visit (INDEPENDENT_AMBULATORY_CARE_PROVIDER_SITE_OTHER): Payer: Managed Care, Other (non HMO) | Admitting: Physician Assistant

## 2022-11-28 ENCOUNTER — Encounter: Payer: Self-pay | Admitting: Physician Assistant

## 2022-11-28 VITALS — BP 110/80 | HR 65 | Temp 97.8°F | Resp 16 | Ht 67.0 in | Wt 204.6 lb

## 2022-11-28 DIAGNOSIS — R519 Headache, unspecified: Secondary | ICD-10-CM

## 2022-11-28 DIAGNOSIS — R5383 Other fatigue: Secondary | ICD-10-CM

## 2022-11-28 DIAGNOSIS — M5481 Occipital neuralgia: Secondary | ICD-10-CM

## 2022-11-28 DIAGNOSIS — Z0001 Encounter for general adult medical examination with abnormal findings: Secondary | ICD-10-CM

## 2022-11-28 DIAGNOSIS — E782 Mixed hyperlipidemia: Secondary | ICD-10-CM | POA: Diagnosis not present

## 2022-11-28 DIAGNOSIS — Z23 Encounter for immunization: Secondary | ICD-10-CM

## 2022-11-28 DIAGNOSIS — R300 Dysuria: Secondary | ICD-10-CM

## 2022-11-28 DIAGNOSIS — R946 Abnormal results of thyroid function studies: Secondary | ICD-10-CM | POA: Diagnosis not present

## 2022-11-28 MED ORDER — TETANUS-DIPHTH-ACELL PERTUSSIS 5-2-15.5 LF-MCG/0.5 IM SUSP
0.5000 mL | Freq: Once | INTRAMUSCULAR | 0 refills | Status: AC
Start: 2022-11-28 — End: 2022-11-28

## 2022-11-28 NOTE — Progress Notes (Signed)
[facility] [address] [place], [place] [address]  Internal MEDICINE  Office Visit Note  Patient Name: Victoria White  [id number]  [id number]  Date of Service: 12/01/2022  Chief Complaint  Patient presents with   Annual Exam   Quality Metric Gaps    TDAP     HPI Pt is here for routine health maintenance examination -Doing well  -mammogram done in June -May be due for tdap and will update this -Having a headache since yesterday, usually gets more cervical or tension headaches but this is more pulsing and radiating from occipital. Ibuprofen has helped. No vision changes. Will call or go to ED if new or worsening symptoms arise -Walking 6 miles per day, working out at gym with her daughter. Is trying intermittent fasting now -Has form for work that will be completed and faxed once labs done  Current Medication: Outpatient Encounter Medications as of 11/28/2022  Medication Sig   [DISCONTINUED] Tdap (ADACEL) 08-17-13.5 LF-MCG/0.5 injection Inject 0.5 mLs into the muscle once.   [EXPIRED] Tdap (ADACEL) 08-17-13.5 LF-MCG/0.5 injection Inject 0.5 mLs into the muscle once for 1 dose.   No facility-administered encounter medications on file as of 11/28/2022.    Surgical History: Past Surgical History:  Procedure Laterality Date   CESAREAN SECTION     OVARIAN CYST REMOVAL     PILONIDAL CYST EXCISION     TUBAL LIGATION Bilateral     Medical History: Past Medical History:  Diagnosis Date   No pertinent past medical history     Family History: Family History  Problem Relation Age of Onset   Cancer Father    Cancer Maternal Grandmother    Cancer Maternal Grandfather    Cancer Paternal Grandmother    Cancer Paternal Grandfather    Asthma Mother       Review of Systems  Constitutional:  Negative for chills, fatigue and unexpected weight change.  HENT:  Negative for congestion, postnasal drip, rhinorrhea, sneezing and sore throat.   Eyes:  Negative for  redness and visual disturbance.  Respiratory:  Negative for cough, chest tightness and shortness of breath.   Cardiovascular:  Negative for chest pain and palpitations.  Gastrointestinal:  Negative for abdominal pain, constipation, diarrhea, nausea and vomiting.  Genitourinary:  Negative for dysuria and frequency.  Musculoskeletal:  Negative for arthralgias, back pain, joint swelling and neck pain.  Skin:  Negative for rash.  Neurological:  Positive for headaches. Negative for tremors and numbness.  Hematological:  Negative for adenopathy. Does not bruise/bleed easily.  Psychiatric/Behavioral:  Negative for behavioral problems (Depression), sleep disturbance and suicidal ideas. The patient is not nervous/anxious.      Vital Signs: BP 110/80   Pulse 65   Temp 97.8 F (36.6 C)   Resp 16   Ht 5\' 7"  (1.702 m)   Wt 204 lb 9.6 oz (92.8 kg)   SpO2 98%   BMI 32.04 kg/m    Physical Exam Vitals and nursing note reviewed.  Constitutional:      General: She is not in acute distress.    Appearance: Normal appearance. She is well-developed. She is obese. She is not diaphoretic.  HENT:     Head: Normocephalic and atraumatic.     Mouth/Throat:     Pharynx: No oropharyngeal exudate.  Eyes:     Pupils: Pupils are equal, round, and reactive to light.  Neck:     Thyroid: No thyromegaly.     Vascular: No JVD.  Trachea: No tracheal deviation.  Cardiovascular:     Rate and Rhythm: Normal rate and regular rhythm.     Heart sounds: Normal heart sounds. No murmur heard.    No friction rub. No gallop.  Pulmonary:     Effort: Pulmonary effort is normal. No respiratory distress.     Breath sounds: No wheezing or rales.  Chest:     Chest wall: No tenderness.  Abdominal:     General: Bowel sounds are normal.     Palpations: Abdomen is soft.     Tenderness: There is no abdominal tenderness.  Musculoskeletal:        General: Normal range of motion.     Cervical back: Normal range of motion  and neck supple.  Lymphadenopathy:     Cervical: No cervical adenopathy.  Skin:    General: Skin is warm and dry.  Neurological:     Mental Status: She is alert and oriented to person, place, and time.     Cranial Nerves: No cranial nerve deficit.  Psychiatric:        Behavior: Behavior normal.        Thought Content: Thought content normal.        Judgment: Judgment normal.      LABS: Recent Results (from the past 2160 hour(s))  CBC w/Diff/Platelet     Status: None   Collection Time: 11/28/22 11:04 AM  Result Value Ref Range   WBC 3.8 3.4 - 10.8 x10E3/uL   RBC 4.57 3.77 - 5.28 x10E6/uL   Hemoglobin 13.5 11.1 - 15.9 g/dL   Hematocrit 40.9 81.1 - 46.6 %   MCV 88 79 - 97 fL   MCH 29.5 26.6 - 33.0 pg   MCHC 33.6 31.5 - 35.7 g/dL   RDW 91.4 78.2 - 95.6 %   Platelets 277 150 - 450 x10E3/uL   Neutrophils 50 Not Estab. %   Lymphs 33 Not Estab. %   Monocytes 9 Not Estab. %   Eos 7 Not Estab. %   Basos 1 Not Estab. %   Neutrophils Absolute 1.9 1.4 - 7.0 x10E3/uL   Lymphocytes Absolute 1.2 0.7 - 3.1 x10E3/uL   Monocytes Absolute 0.3 0.1 - 0.9 x10E3/uL   EOS (ABSOLUTE) 0.3 0.0 - 0.4 x10E3/uL   Basophils Absolute 0.0 0.0 - 0.2 x10E3/uL   Immature Granulocytes 0 Not Estab. %   Immature Grans (Abs) 0.0 0.0 - 0.1 x10E3/uL  Comprehensive metabolic panel     Status: Abnormal   Collection Time: 11/28/22 11:04 AM  Result Value Ref Range   Glucose 95 70 - 99 mg/dL   BUN 9 6 - 24 mg/dL   Creatinine, Ser 2.13 0.57 - 1.00 mg/dL   eGFR 086 >57 QI/ONG/2.95   BUN/Creatinine Ratio 13 9 - 23   Sodium 141 134 - 144 mmol/L   Potassium 4.5 3.5 - 5.2 mmol/L   Chloride 107 (H) 96 - 106 mmol/L   CO2 24 20 - 29 mmol/L   Calcium 9.3 8.7 - 10.2 mg/dL   Total Protein 6.5 6.0 - 8.5 g/dL   Albumin 4.2 3.9 - 4.9 g/dL   Globulin, Total 2.3 1.5 - 4.5 g/dL   Bilirubin Total 0.6 0.0 - 1.2 mg/dL   Alkaline Phosphatase 50 44 - 121 IU/L   AST 16 0 - 40 IU/L   ALT 12 0 - 32 IU/L  TSH + free T4      Status: None   Collection Time: 11/28/22 11:04 AM  Result Value  Ref Range   TSH 0.711 0.450 - 4.500 uIU/mL   Free T4 1.12 0.82 - 1.77 ng/dL  Lipid Panel With LDL/HDL Ratio     Status: None   Collection Time: 11/28/22 11:04 AM  Result Value Ref Range   Cholesterol, Total 155 100 - 199 mg/dL   Triglycerides 161 0 - 149 mg/dL   HDL 44 >09 mg/dL   VLDL Cholesterol Cal 23 5 - 40 mg/dL   LDL Chol Calc (NIH) 88 0 - 99 mg/dL   LDL/HDL Ratio 2.0 0.0 - 3.2 ratio    Comment:                                     LDL/HDL Ratio                                             Men  Women                               1/2 Avg.Risk  1.0    1.5                                   Avg.Risk  3.6    3.2                                2X Avg.Risk  6.2    5.0                                3X Avg.Risk  8.0    6.1         Assessment/Plan: 1. Encounter for general adult medical examination with abnormal findings CPE performed, labs ordered, mammogram done  2. Bilateral occipital neuralgia Hx of cervicalgia and tension headaches, with new occipital tenderness. Pt called back and does request neurology referral, knows to call or go to ED if new or worsening symptoms arise - Ambulatory referral to Neurology  3. Worsening headaches Pt called back and does request neurology referral, knows to call or go to ED if new or worsening symptoms arise - Ambulatory referral to Neurology  4. Mixed hyperlipidemia - Lipid Panel With LDL/HDL Ratio  5. Abnormal thyroid exam - TSH + free T4  6. Need for Tdap vaccination - Tdap (ADACEL) 08-17-13.5 LF-MCG/0.5 injection; Inject 0.5 mLs into the muscle once for 1 dose.  Dispense: 0.5 mL; Refill: 0  7. Other fatigue - CBC w/Diff/Platelet - Comprehensive metabolic panel - TSH + free T4 - Lipid Panel With LDL/HDL Ratio  8. Dysuria - UA/M w/rflx Culture, Routine   General Counseling: Amina verbalizes understanding of the findings of todays visit and agrees with plan of  treatment. I have discussed any further diagnostic evaluation that may be needed or ordered today. We also reviewed her medications today. she has been encouraged to call the office with any questions or concerns that should arise related to todays visit.    Counseling:    Orders Placed This Encounter  Procedures   UA/M w/rflx Culture, Routine   CBC w/Diff/Platelet  Comprehensive metabolic panel   TSH + free T4   Lipid Panel With LDL/HDL Ratio   Ambulatory referral to Neurology    Meds ordered this encounter  Medications   Tdap (ADACEL) 08-17-13.5 LF-MCG/0.5 injection    Sig: Inject 0.5 mLs into the muscle once for 1 dose.    Dispense:  0.5 mL    Refill:  0    This patient was seen by Lynn Ito, PA-C in collaboration with Dr. Beverely Risen as a part of collaborative care agreement.  Total time spent:35 Minutes  Time spent includes review of chart, medications, test results, and follow up plan with the patient.     Lyndon Code, MD  Internal Medicine

## 2022-12-01 ENCOUNTER — Telehealth: Payer: Self-pay | Admitting: Physician Assistant

## 2022-12-01 NOTE — Telephone Encounter (Signed)
Neurology referral sent via [facility] to [facility]. Notified patient. Gave pt telephone # [phone]

## 2022-12-14 ENCOUNTER — Telehealth: Payer: Self-pay | Admitting: Physician Assistant

## 2022-12-14 NOTE — Telephone Encounter (Signed)
Neurology appointment 02/05/2023 @ [facility]

## 2023-01-30 ENCOUNTER — Telehealth: Payer: Self-pay

## 2023-01-30 MED ORDER — FLUCONAZOLE 150 MG PO TABS: ORAL_TABLET | ORAL | 0 refills | Status: AC

## 2023-01-30 NOTE — Telephone Encounter (Signed)
Pt advised as per lauren 1 sent diflucan

## 2023-07-17 ENCOUNTER — Other Ambulatory Visit: Payer: Self-pay | Admitting: Physician Assistant

## 2023-07-17 DIAGNOSIS — Z1231 Encounter for screening mammogram for malignant neoplasm of breast: Secondary | ICD-10-CM

## 2023-10-04 ENCOUNTER — Ambulatory Visit
Admission: RE | Admit: 2023-10-04 | Discharge: 2023-10-04 | Disposition: A | Discharge status: Home / Self Care | Source: Ambulatory Visit | Attending: Physician Assistant | Admitting: Physician Assistant

## 2023-10-04 DIAGNOSIS — Z1231 Encounter for screening mammogram for malignant neoplasm of breast: Secondary | ICD-10-CM | POA: Diagnosis present

## 2023-11-28 ENCOUNTER — Telehealth: Payer: Self-pay | Admitting: Physician Assistant

## 2023-11-28 NOTE — Telephone Encounter (Signed)
 Left vm and sent mychart message to confirm 12/04/23 appointment-[place]

## 2023-12-04 ENCOUNTER — Encounter: Payer: Managed Care, Other (non HMO) | Admitting: Physician Assistant
